# Patient Record
Sex: Male | Born: 1980 | Race: White | Hispanic: No | Marital: Single | Smoking: Current every day smoker
Health system: Southern US, Community
[De-identification: ages and names within clinical notes are randomized; demographics above are authoritative.]

## PROBLEM LIST (undated history)

## (undated) DIAGNOSIS — R569 Unspecified convulsions: Secondary | ICD-10-CM

---

## 2006-09-30 ENCOUNTER — Emergency Department: Payer: Self-pay | Admitting: Emergency Medicine

## 2006-09-30 ENCOUNTER — Other Ambulatory Visit: Payer: Self-pay

## 2011-10-22 ENCOUNTER — Emergency Department: Payer: Self-pay | Admitting: *Deleted

## 2013-01-08 ENCOUNTER — Emergency Department: Payer: Self-pay | Admitting: Emergency Medicine

## 2013-01-08 LAB — BASIC METABOLIC PANEL
BUN: 7 mg/dL (ref 7–18)
Calcium, Total: 9.4 mg/dL (ref 8.5–10.1)
Chloride: 101 mmol/L (ref 98–107)
Co2: 30 mmol/L (ref 21–32)
Creatinine: 1.01 mg/dL (ref 0.60–1.30)

## 2013-01-08 LAB — TROPONIN I: Troponin-I: <0.02 ng/mL

## 2013-01-08 LAB — DRUG SCREEN, URINE
Amphetamines, Ur Screen: NEGATIVE (ref ?–1000)
Barbiturates, Ur Screen: NEGATIVE (ref ?–200)
Cannabinoid 50 Ng, Ur ~~LOC~~: POSITIVE (ref ?–50)
Cocaine Metabolite,Ur ~~LOC~~: NEGATIVE (ref ?–300)
MDMA (Ecstasy)Ur Screen: NEGATIVE (ref ?–500)
Opiate, Ur Screen: NEGATIVE (ref ?–300)
Tricyclic, Ur Screen: NEGATIVE (ref ?–1000)

## 2013-01-08 LAB — VALPROIC ACID LEVEL: Valproic Acid: 78 ug/mL

## 2013-01-08 LAB — CBC
MCH: 31.5 pg (ref 26.0–34.0)
MCV: 89 fL (ref 80–100)
Platelet: 258 10*3/uL (ref 150–440)
RDW: 13.1 % (ref 11.5–14.5)
WBC: 13.4 10*3/uL — ABNORMAL HIGH (ref 3.8–10.6)

## 2013-01-08 LAB — HEPATIC FUNCTION PANEL A (ARMC)
Albumin: 3.7 g/dL (ref 3.4–5.0)
Bilirubin,Total: 0.2 mg/dL (ref 0.2–1.0)
SGOT(AST): 11 U/L — ABNORMAL LOW (ref 15–37)
SGPT (ALT): 12 U/L (ref 12–78)
Total Protein: 7.7 g/dL (ref 6.4–8.2)

## 2014-06-05 ENCOUNTER — Emergency Department: Payer: Self-pay | Admitting: Emergency Medicine

## 2014-08-15 ENCOUNTER — Emergency Department
Admission: EM | Admit: 2014-08-15 | Discharge: 2014-08-15 | Disposition: A | Discharge status: Home / Self Care | Payer: Self-pay | Attending: Student | Admitting: Student

## 2014-08-15 ENCOUNTER — Encounter: Payer: Self-pay | Admitting: Emergency Medicine

## 2014-08-15 DIAGNOSIS — R197 Diarrhea, unspecified: Secondary | ICD-10-CM | POA: Insufficient documentation

## 2014-08-15 DIAGNOSIS — R112 Nausea with vomiting, unspecified: Secondary | ICD-10-CM | POA: Insufficient documentation

## 2014-08-15 DIAGNOSIS — Z720 Tobacco use: Secondary | ICD-10-CM | POA: Insufficient documentation

## 2014-08-15 MED ORDER — DIPHENOXYLATE-ATROPINE 2.5-0.025 MG PO TABS: 1.0000 | ORAL_TABLET | Freq: Four times a day (QID) | ORAL | Status: AC | PRN

## 2014-08-15 MED ORDER — ONDANSETRON HCL 4 MG PO TABS: 4.0000 mg | ORAL_TABLET | Freq: Every day | ORAL | Status: AC | PRN

## 2014-08-15 NOTE — ED Notes (Signed)
Pt came in the ED with the c/o vomiting and nausea for the past 2 days. He has vomited about 8 times and the last emesis was at 12 am last night. He states that it started after  he ate at a new restaurant. Patient reports of having loose stool and has stooled 5 times in the last 2 days. Pt has had nothing to eat since morning but has had some tea and was able to keep it down. Pt reports no abdominal pain.

## 2014-08-15 NOTE — ED Notes (Signed)
Presents with n/v/d for 2 daysa  Last time vomited was last pm needs work note

## 2014-08-15 NOTE — ED Provider Notes (Signed)
[facility] Emergency Department Provider Note  ____________________________________________  Time seen: Approximately 1:46 PM  I have reviewed the triage vital signs and the nursing notes.   HISTORY  Chief Complaint Emesis    HPI Ian Mckee is a 34 y.o. male in the 2 days of vomiting and nausea and diarrhea. States onset 2 days ago status post eating at [employer]. Patient stated was a meat sandwich with mayonnaise vegetables. Patient said he has not eaten anything today is able to tolerate fluids. Patient denies any abdominal pain. Denies any fever or chills.   History reviewed. No pertinent past medical history.  There are no active problems to display for this patient.   History reviewed. No pertinent past surgical history.  No current outpatient prescriptions on file.  Allergies Review of patient's allergies indicates no known allergies.  No family history on file.  Social History History  Substance Use Topics  . Smoking status: Current Every Day Smoker  . Smokeless tobacco: Not on file  . Alcohol Use: No    Review of Systems Constitutional: No fever/chills Eyes: No visual changes. ENT: No sore throat. Cardiovascular: Denies chest pain. Respiratory: Denies shortness of breath. Gastrointestinal: No abdominal pain. Nausea vomiting and diarrhea. Genitourinary: Negative for dysuria. Musculoskeletal: Negative for back pain. Skin: Negative for rash. Neurological: Negative for headaches, focal weakness or numbness. 10-point ROS otherwise negative.  ____________________________________________   PHYSICAL EXAM:  VITAL SIGNS: ED Triage Vitals  Enc Vitals Group     BP 08/15/14 1232 118/80 mmHg     Pulse Rate 08/15/14 1232 105     Resp 08/15/14 1232 20     Temp 08/15/14 1232 98.6 F (37 C)     Temp Source 08/15/14 1232 Oral     SpO2 08/15/14 1232 100 %     Weight 08/15/14 1232 125 lb (56.7 kg)     Height 08/15/14 1232  5\' 7"  (1.702 m)     Head Cir --      Peak Flow --      Pain Score 08/15/14 1227 0     Pain Loc --      Pain Edu? --      Excl. in GC? --    Constitutional: Alert and oriented. Well appearing and in no acute distress. Eyes: Conjunctivae are normal. PERRL. EOMI. Head: Atraumatic. Nose: No congestion/rhinnorhea. Mouth/Throat: Mucous membranes are moist.  Oropharynx non-erythematous. Neck: No stridor no deformity. Nuchal range of motion nontender palpation. Hematological/Lymphatic/Immunilogical: No cervical lymphadenopathy. Cardiovascular: Normal rate, regular rhythm. Grossly normal heart sounds.  Good peripheral circulation. Respiratory: Normal respiratory effort.  No retractions. Lungs CTAB. Gastrointestinal: Soft and nontender. No distention. Hyperactive bowel sounds. No CVA gotten. Musculoskeletal: No lower extremity tenderness nor edema.  No joint effusions. Neurologic:  Normal speech and language. No gross focal neurologic deficits are appreciated. Speech is normal. No gait instability. Skin:  Skin is warm, dry and intact. No rash noted. Psychiatric: Mood and affect are normal. Speech and behavior are normal.  ____________________________________________   LABS (all labs ordered are listed, but only abnormal results are displayed)  Labs Reviewed - No data to display ____________________________________________  EKG   ____________________________________________  RADIOLOGY   ____________________________________________   PROCEDURES  Procedure(s) performed: None  Critical Care performed: No  ____________________________________________   INITIAL IMPRESSION / ASSESSMENT AND PLAN / ED COURSE  Pertinent labs & imaging results that were available during my care of the patient were reviewed by me and considered in my  medical decision making (see chart for details).  Gastroenteritis. ____________________________________________   FINAL CLINICAL IMPRESSION(S) / ED  DIAGNOSES  Final diagnoses:  Nausea vomiting and diarrhea      QUIRINO KAKOS, PA-C 08/15/14 1352  Gayla Doss, MD 08/15/14 1447

## 2015-01-22 ENCOUNTER — Encounter: Payer: Self-pay | Admitting: Emergency Medicine

## 2015-01-22 ENCOUNTER — Emergency Department
Admission: EM | Admit: 2015-01-22 | Discharge: 2015-01-22 | Disposition: A | Discharge status: Home / Self Care | Payer: Self-pay | Attending: Emergency Medicine | Admitting: Emergency Medicine

## 2015-01-22 ENCOUNTER — Emergency Department: Payer: Self-pay

## 2015-01-22 DIAGNOSIS — Y9289 Other specified places as the place of occurrence of the external cause: Secondary | ICD-10-CM | POA: Insufficient documentation

## 2015-01-22 DIAGNOSIS — Y998 Other external cause status: Secondary | ICD-10-CM | POA: Insufficient documentation

## 2015-01-22 DIAGNOSIS — Y9389 Activity, other specified: Secondary | ICD-10-CM | POA: Insufficient documentation

## 2015-01-22 DIAGNOSIS — S39012A Strain of muscle, fascia and tendon of lower back, initial encounter: Secondary | ICD-10-CM | POA: Insufficient documentation

## 2015-01-22 DIAGNOSIS — Z720 Tobacco use: Secondary | ICD-10-CM | POA: Insufficient documentation

## 2015-01-22 DIAGNOSIS — R52 Pain, unspecified: Secondary | ICD-10-CM

## 2015-01-22 DIAGNOSIS — Z79899 Other long term (current) drug therapy: Secondary | ICD-10-CM | POA: Insufficient documentation

## 2015-01-22 DIAGNOSIS — X501XXA Overexertion from prolonged static or awkward postures, initial encounter: Secondary | ICD-10-CM | POA: Insufficient documentation

## 2015-01-22 HISTORY — DX: Unspecified convulsions: R56.9

## 2015-01-22 MED ORDER — NAPROXEN 500 MG PO TABS: 500.0000 mg | ORAL_TABLET | Freq: Two times a day (BID) | ORAL | Status: DC

## 2015-01-22 MED ORDER — HYDROMORPHONE HCL 1 MG/ML IJ SOLN
1.0000 mg | Freq: Once | INTRAMUSCULAR | Status: AC
  Administered 2015-01-22: 1 mg via INTRAVENOUS
  Filled 2015-01-22: qty 1

## 2015-01-22 MED ORDER — CYCLOBENZAPRINE HCL 10 MG PO TABS: 10.0000 mg | ORAL_TABLET | Freq: Every day | ORAL | Status: DC

## 2015-01-22 MED ORDER — KETOROLAC TROMETHAMINE 30 MG/ML IJ SOLN: 30.0000 mg | Freq: Once | INTRAMUSCULAR | Status: DC

## 2015-01-22 MED ORDER — ONDANSETRON HCL 4 MG/2ML IJ SOLN
4.0000 mg | Freq: Once | INTRAMUSCULAR | Status: AC
  Administered 2015-01-22: 4 mg via INTRAVENOUS
  Filled 2015-01-22: qty 2

## 2015-01-22 MED ORDER — ORPHENADRINE CITRATE 30 MG/ML IJ SOLN
60.0000 mg | Freq: Two times a day (BID) | INTRAMUSCULAR | Status: DC
  Administered 2015-01-22: 60 mg via INTRAVENOUS
  Filled 2015-01-22: qty 2

## 2015-01-22 MED ORDER — METHOCARBAMOL 750 MG PO TABS: 1500.0000 mg | ORAL_TABLET | Freq: Four times a day (QID) | ORAL | Status: DC

## 2015-01-22 MED ORDER — KETOROLAC TROMETHAMINE 10 MG PO TABS: 10.0000 mg | ORAL_TABLET | Freq: Four times a day (QID) | ORAL | Status: DC | PRN

## 2015-01-22 NOTE — ED Notes (Signed)
Patient medicated for pain. Tolerated well. Family at bedside. Call bell within reach.

## 2015-01-22 NOTE — ED Provider Notes (Deleted)
CSN: [id number]     Arrival date & time 01/22/15  1003 History   First MD Initiated Contact with Patient 01/22/15 1012     Chief Complaint  Patient presents with  . Back Pain    HPI Comments: 34 year old male presents today complaining of low back pain that started last night. Patient reports there was no specific injury that caused the pain. He has had this problem once before in the past. He has never had a back surgery or required an MRI. He has not taken anything over-the-counter for his pain. It hurts on the left side of his back, it does not radiate down his legs. He has not had a loss of bowel or bladder function. No perianal or genital numbness.  The history is provided by the patient.    Past Medical History  Diagnosis Date  . Seizures (HCC)    History reviewed. No pertinent past surgical history. History reviewed. No pertinent family history. Social History  Substance Use Topics  . Smoking status: Current Every Day Smoker  . Smokeless tobacco: None  . Alcohol Use: No    Review of Systems  Musculoskeletal: Positive for myalgias, back pain and arthralgias.  All other systems reviewed and are negative.     Allergies  Review of patient's allergies indicates no known allergies.  Home Medications   Prior to Admission medications   Medication Sig Start Date End Date Taking? Authorizing Provider  divalproex (DEPAKOTE) 500 MG DR tablet Take 500 mg by mouth 3 (three) times daily.   Yes Historical Provider, MD  cyclobenzaprine (FLEXERIL) 10 MG tablet Take 1 tablet (10 mg total) by mouth at bedtime. 01/22/15   Luvenia ReddenEmma Weavil V, PA-C  naproxen (NAPROSYN) 500 MG tablet Take 1 tablet (500 mg total) by mouth 2 (two) times daily with a meal. 01/22/15   Wilber OliphantEmma Weavil V, PA-C  ondansetron (ZOFRAN) 4 MG tablet Take 1 tablet (4 mg total) by mouth daily as needed for nausea or vomiting. 08/15/14 08/15/15  Joni Reiningonald K Smith, PA-C   BP 121/72 mmHg  Pulse 82  Temp(Src) 98.2 F (36.8 C) (Oral)   Resp 18  Ht 5\' 6"  (1.676 m)  Wt 120 lb (54.432 kg)  BMI 19.38 kg/m2  SpO2 99% Physical Exam  Constitutional: He is oriented to person, place, and time. Vital signs are normal. He appears well-developed and well-nourished. He is active.  Non-toxic appearance. He does not have a sickly appearance. He does not appear ill.  HENT:  Head: Normocephalic and atraumatic.  Musculoskeletal: Normal range of motion. He exhibits tenderness.       Lumbar back: Normal.       Back:  Neurological: He is alert and oriented to person, place, and time. He has normal strength. No sensory deficit. Gait normal.  5/5 bilateral LE strength  Great toe raise equal bilaterally  Ambulates with normal gait   Skin: Skin is warm and dry.  Psychiatric: He has a normal mood and affect. His behavior is normal. Judgment and thought content normal.  Nursing note and vitals reviewed.   ED Course  Procedures (including critical care time) Labs Review Labs Reviewed - No data to display  Imaging Review No results found. I have personally reviewed and evaluated these images and lab results as part of my medical decision-making.   EKG Interpretation None      MDM  Nontraumatic back pain, no radiculopathy, no red flag warning signs, no bony tenderness. Treat with flexeril/naproxen, moist heat. Follow up  with PCP if no improvement  Final diagnoses:  Lumbar paraspinal muscle spasm        Wilber Oliphant V, PA-C 01/22/15 1037

## 2015-01-22 NOTE — ED Notes (Signed)
States having lower back pain since last pm  States pain is right lower back and started after getting a shirt out of drawer ..Marland Kitchen

## 2015-01-22 NOTE — ED Provider Notes (Signed)
[facility] Emergency Department Provider Note  ____________________________________________  Time seen: Approximately 10:40 AM  I have reviewed the triage vital signs and the nursing notes.   HISTORY  Chief Complaint Back Pain    HPI Ian Mckee is a 34 y.o. male male patient arrived via EMS for complaint of low back pain which occurred last night. Patient lives a twisting incident when he was trying to remove some clothing from a dresser drawer. Patient said he had a very restless night secondary to the back pain. Patient denies any bladder or bowel dysfunction. Patient denies any radicular component to this pain. No palliative measures taken for this complaint.Patient is rating his pain as a 10 over 10.   Past Medical History  Diagnosis Date  . Seizures (HCC)     There are no active problems to display for this patient.   History reviewed. No pertinent past surgical history.  Current Outpatient Rx  Name  Route  Sig  Dispense  Refill  . divalproex (DEPAKOTE) 500 MG DR tablet   Oral   Take 500 mg by mouth 3 (three) times daily.         Marland Kitchen. ketorolac (TORADOL) 10 MG tablet   Oral   Take 1 tablet (10 mg total) by mouth every 6 (six) hours as needed.   20 tablet   0   . methocarbamol (ROBAXIN-750) 750 MG tablet   Oral   Take 2 tablets (1,500 mg total) by mouth 4 (four) times daily.   40 tablet   0   . ondansetron (ZOFRAN) 4 MG tablet   Oral   Take 1 tablet (4 mg total) by mouth daily as needed for nausea or vomiting.   30 tablet   1     Allergies Review of patient's allergies indicates no known allergies.  History reviewed. No pertinent family history.  Social History Social History  Substance Use Topics  . Smoking status: Current Every Day Smoker  . Smokeless tobacco: None  . Alcohol Use: No    Review of Systems Constitutional: No fever/chills Eyes: No visual changes. ENT: No sore throat. Cardiovascular: Denies  chest pain. Respiratory: Denies shortness of breath. Gastrointestinal: No abdominal pain.  No nausea, no vomiting.  No diarrhea.  No constipation. Genitourinary: Negative for dysuria. Musculoskeletal: Positive for back pain. Skin: Negative for rash. Neurological: Negative for headaches, focal weakness or numbness. 10-point ROS otherwise negative.  ____________________________________________   PHYSICAL EXAM:  VITAL SIGNS: ED Triage Vitals  Enc Vitals Group     BP 01/22/15 1011 121/72 mmHg     Pulse Rate 01/22/15 1011 82     Resp 01/22/15 1011 18     Temp 01/22/15 1011 98.2 F (36.8 C)     Temp Source 01/22/15 1011 Oral     SpO2 01/22/15 1011 99 %     Weight 01/22/15 1011 120 lb (54.432 kg)     Height 01/22/15 1011 5\' 6"  (1.676 m)     Head Cir --      Peak Flow --      Pain Score 01/22/15 1006 10     Pain Loc --      Pain Edu? --      Excl. in GC? --     Constitutional: Alert and oriented. Well appearing and in no acute distress. Eyes: Conjunctivae are normal. PERRL. EOMI. Head: Atraumatic. Nose: No congestion/rhinnorhea. Mouth/Throat: Mucous membranes are moist.  Oropharynx non-erythematous. Neck: No stridor.  No cervical spine  tenderness to palpation. Hematological/Lymphatic/Immunilogical: No cervical lymphadenopathy. Cardiovascular: Normal rate, regular rhythm. Grossly normal heart sounds.  Good peripheral circulation. Respiratory: Normal respiratory effort.  No retractions. Lungs CTAB. Gastrointestinal: Soft and nontender. No distention. No abdominal bruits. No CVA tenderness. Musculoskeletal: No lower extremity tenderness nor edema.  No joint effusions. Neurologic:  Normal speech and language. No gross focal neurologic deficits are appreciated. No gait instability. Skin:  Skin is warm, dry and intact. No rash noted. Psychiatric: Mood and affect are normal. Speech and behavior are normal.  ____________________________________________   LABS (all labs ordered are  listed, but only abnormal results are displayed)  Labs Reviewed - No data to display ____________________________________________  EKG   ____________________________________________  RADIOLOGY  No acute findings. I, Joni Reining, personally viewed and evaluated these images (plain radiographs) as part of my medical decision making.   ____________________________________________   PROCEDURES  Procedure(s) performed: None  Critical Care performed: No  ____________________________________________   INITIAL IMPRESSION / ASSESSMENT AND PLAN / ED COURSE  Pertinent labs & imaging results that were available during my care of the patient were reviewed by me and considered in my medical decision making (see chart for details).  Acute lumbar strain. Patient pain level decreased status post Dilaudid, Toradol, and Norflex. Patient given a prescription for Toradol and Robaxin. Patient advised follow-up with family doctor if condition persists. ____________________________________________   FINAL CLINICAL IMPRESSION(S) / ED DIAGNOSES  Final diagnoses:  Lumbar strain, initial encounter      SHANARD TRETO, PA-C 01/22/15 1255  Sharman Cheek, MD 01/22/15 [phone]

## 2015-01-22 NOTE — ED Notes (Signed)
Pt to ed with c/o back pain that started yesterday after twisting while dressing yesterday.  Pt brought in by [facility] with 18g right ac saline lock in place.

## 2018-12-15 ENCOUNTER — Emergency Department (HOSPITAL_COMMUNITY): Payer: Self-pay

## 2018-12-15 ENCOUNTER — Emergency Department (HOSPITAL_COMMUNITY)
Admission: EM | Admit: 2018-12-15 | Discharge: 2018-12-15 | Disposition: A | Discharge status: Home / Self Care | Payer: Self-pay | Attending: Emergency Medicine | Admitting: Emergency Medicine

## 2018-12-15 ENCOUNTER — Other Ambulatory Visit: Payer: Self-pay

## 2018-12-15 ENCOUNTER — Encounter (HOSPITAL_COMMUNITY): Payer: Self-pay | Admitting: Emergency Medicine

## 2018-12-15 DIAGNOSIS — X58XXXA Exposure to other specified factors, initial encounter: Secondary | ICD-10-CM | POA: Insufficient documentation

## 2018-12-15 DIAGNOSIS — S43005A Unspecified dislocation of left shoulder joint, initial encounter: Secondary | ICD-10-CM

## 2018-12-15 DIAGNOSIS — Y999 Unspecified external cause status: Secondary | ICD-10-CM | POA: Insufficient documentation

## 2018-12-15 DIAGNOSIS — S43015A Anterior dislocation of left humerus, initial encounter: Secondary | ICD-10-CM | POA: Insufficient documentation

## 2018-12-15 DIAGNOSIS — F17210 Nicotine dependence, cigarettes, uncomplicated: Secondary | ICD-10-CM | POA: Insufficient documentation

## 2018-12-15 DIAGNOSIS — Y9389 Activity, other specified: Secondary | ICD-10-CM | POA: Insufficient documentation

## 2018-12-15 DIAGNOSIS — Y92003 Bedroom of unspecified non-institutional (private) residence as the place of occurrence of the external cause: Secondary | ICD-10-CM | POA: Insufficient documentation

## 2018-12-15 MED ORDER — FENTANYL CITRATE (PF) 100 MCG/2ML IJ SOLN
50.0000 ug | Freq: Once | INTRAMUSCULAR | Status: AC
  Administered 2018-12-15: 06:00:00 50 ug via INTRAVENOUS
  Filled 2018-12-15: qty 2

## 2018-12-15 MED ORDER — HYDROCODONE-ACETAMINOPHEN 5-325 MG PO TABS: 1.0000 | ORAL_TABLET | Freq: Four times a day (QID) | ORAL | 0 refills | Status: DC | PRN

## 2018-12-15 MED ORDER — PROPOFOL 10 MG/ML IV BOLUS
200.0000 mg | Freq: Once | INTRAVENOUS | Status: AC
  Administered 2018-12-15: 07:00:00 8 mg via INTRAVENOUS
  Filled 2018-12-15: qty 20

## 2018-12-15 MED ORDER — FENTANYL CITRATE (PF) 100 MCG/2ML IJ SOLN
25.0000 ug | Freq: Once | INTRAMUSCULAR | Status: AC
  Administered 2018-12-15: 25 ug via INTRAVENOUS

## 2018-12-15 MED ORDER — FENTANYL CITRATE (PF) 100 MCG/2ML IJ SOLN
INTRAMUSCULAR | Status: AC
  Filled 2018-12-15: qty 2

## 2018-12-15 NOTE — Discharge Instructions (Addendum)
You need to wear the shoulder immobilizer for the next 2 weeks.  If you do not your shoulder can pop back out of joint again.  Use ice packs for comfort.  Take the medication as prescribed.  You could also take ibuprofen 400 mg 4 times a day for pain.  Please follow-up with Dr. Aline Brochure, the orthopedist on call.

## 2018-12-15 NOTE — ED Provider Notes (Addendum)
[facility]ERGENCY DEPARTMENT Provider Note   CSN: [id number] Arrival date & time: 12/15/18  0458   Time seen 5:30 AM  History   Chief Complaint Chief Complaint  Patient presents with  . Shoulder Pain    HPI Ian Mckee is a 38 y.o. male.     HPI patient presents via EMS.  He states he is right-handed.  He states he woke up and he feels like his left shoulder is out of joint.  He denies any known injury although he states he sleeps of his arm under his pillow and may have rolled wrong.  He states he had a shoulder dislocation about 10 years ago when he was moving a couch and it popped out of joint.  He states he has numbness in his left hand up to the level of the wrist.  Patient has a history of seizure disorder, he does not know if he had a seizure.  PCP Patient, No Pcp Per   Past Medical History:  Diagnosis Date  . Seizures (HCC)     There are no active problems to display for this patient.   History reviewed. No pertinent surgical history.      Home Medications    Prior to Admission medications   Medication Sig Start Date End Date Taking? Authorizing Provider  divalproex (DEPAKOTE) 500 MG DR tablet Take 500 mg by mouth 3 (three) times daily.    [provider]  HYDROcodone-acetaminophen (NORCO/VICODIN) 5-325 MG tablet Take 1 tablet by mouth every 6 (six) hours as needed. 12/15/18   Devoria AlbeKnapp, Fredi Hurtado, MD  ketorolac (TORADOL) 10 MG tablet Take 1 tablet (10 mg total) by mouth every 6 (six) hours as needed. 01/22/15   Joni ReiningSmith, Teon K, PA-C  methocarbamol (ROBAXIN-750) 750 MG tablet Take 2 tablets (1,500 mg total) by mouth 4 (four) times daily. 01/22/15   Joni ReiningSmith, Raven K, PA-C    Family History History reviewed. No pertinent family history.  Social History Social History   Tobacco Use  . Smoking status: Current Every Day Smoker    Packs/day: 0.50  . Smokeless tobacco: Never Used  Substance Use Topics  . Alcohol use: No  . Drug use: No  unemployed  Takes care of mother   Allergies   Patient has no known allergies.   Review of Systems Review of Systems  All other systems reviewed and are negative.    Physical Exam Updated Vital Signs BP (!) 129/92   Pulse 71   Temp 98 F (36.7 C) (Oral)   Resp 19   Ht 5\' 6"  (1.676 m)   Wt 54.4 kg   SpO2 100%   BMI 19.37 kg/m   Physical Exam Vitals signs and nursing note reviewed.  Constitutional:      General: He is in acute distress.     Appearance: Normal appearance. He is normal weight.  HENT:     Head: Normocephalic.     Right Ear: External ear normal.     Left Ear: External ear normal.  Eyes:     Extraocular Movements: Extraocular movements intact.     Conjunctiva/sclera: Conjunctivae normal.  Neck:     Musculoskeletal: Normal range of motion.  Cardiovascular:     Rate and Rhythm: Normal rate.  Pulmonary:     Effort: Pulmonary effort is normal. No respiratory distress.  Musculoskeletal:        General: Deformity present.     Comments: Patient is noted to have a step-off in his  left shoulder area and I can feel the humeral head anteriorly.  He has good distal pulses.  Skin:    General: Skin is warm and dry.     Findings: No rash.  Neurological:     General: No focal deficit present.     Mental Status: He is alert and oriented to person, place, and time.     Cranial Nerves: Cranial nerve deficit present.  Psychiatric:        Mood and Affect: Mood normal.        Behavior: Behavior normal.        Thought Content: Thought content normal.      ED Treatments / Results  Labs (all labs ordered are listed, but only abnormal results are displayed) Labs Reviewed - No data to display  EKG None  Radiology Dg Shoulder Left Portable  Result Date: 12/15/2018 CLINICAL DATA:  Post reduction left shoulder. EXAM: LEFT SHOULDER - 1 VIEW COMPARISON:  12/15/2018. FINDINGS: Patient status post reduction previously identified left shoulder anterior dislocation. Mild anterior  subluxation cannot be excluded. No acute bony abnormality. No evidence of fracture. No evidence of separation. IMPRESSION: Patient status post reduction previously identified left shoulder anterior dislocation. Mild anterior subluxation cannot be excluded. No acute bony abnormality. Electronically Signed   By: Marcello Moores  Register   On: 12/15/2018 07:14   Dg Shoulder Left Portable  Result Date: 12/15/2018 CLINICAL DATA:  Woke with left shoulder dislocation EXAM: LEFT SHOULDER - 1 VIEW COMPARISON:  None. FINDINGS: Anterior glenohumeral dislocation without fracture. Normal AC joint alignment. IMPRESSION: Anterior glenohumeral dislocation. Electronically Signed   By: Monte Fantasia M.D.   On: 12/15/2018 06:22    Procedures .Sedation  Date/Time: 12/15/2018 6:25 AM Performed by: Rolland Porter, MD Authorized by: Rolland Porter, MD   Consent:    Consent obtained:  Written   Consent given by:  Patient Universal protocol:    Immediately prior to procedure a time out was called: yes     Patient identity confirmation method:  Provided demographic data, verbally with patient, arm band and hospital-assigned identification number Indications:    Procedure performed:  Dislocation reduction   Procedure necessitating sedation performed by:  Physician performing sedation Pre-sedation assessment:    Time since last food or drink:  Many hours   ASA classification: class 1 - normal, healthy patient     Neck mobility: normal     Mouth opening:  3 or more finger widths   Mallampati score:  I - soft palate, uvula, fauces, pillars visible   Pre-sedation assessments completed and reviewed: airway patency, cardiovascular function, hydration status, mental status, pain level, respiratory function and temperature     Pre-sedation assessments completed and reviewed: nausea/vomiting not reviewed     Pre-sedation assessment completed:  12/15/2018 6:25 AM Immediate pre-procedure details:    Reassessment: Patient reassessed  immediately prior to procedure     Reviewed: vital signs     Verified: bag valve mask available, emergency equipment available, intubation equipment available, IV patency confirmed and oxygen available   Procedure details (see MAR for exact dosages):    Preoxygenation:  Nasal cannula   Sedation:  Propofol   Intended level of sedation: deep   Analgesia:  Fentanyl   Intra-procedure monitoring:  Blood pressure monitoring, continuous capnometry, cardiac monitor, continuous pulse oximetry, frequent LOC assessments and frequent vital sign checks   Intra-procedure events: none     Intra-procedure management:  Airway repositioning   Total Provider sedation time (minutes):  20 Post-procedure  details:    Post-sedation assessment completed:  12/15/2018 6:35 AM   Attendance: Constant attendance by certified staff until patient recovered     Recovery: Patient returned to pre-procedure baseline     Post-sedation assessments completed and reviewed: airway patency, cardiovascular function, hydration status, mental status, pain level, respiratory function and temperature     Post-sedation assessments completed and reviewed: nausea/vomiting not reviewed     Patient tolerance:  Tolerated well, no immediate complications Comments:     Propofol was titrated, patient received full sedation at 80 mg IV.  This was obtained at 6:29 AM.  He awakened to verbal and tactile stimuli at 634 and was able to hold conversation.  His pulse ox remained 98 to 100% however he did start to have some snoring and a chin lift was done just to relieve possible obstruction of his airway from his tongue. Reduction of dislocation  Date/Time: 12/15/2018 6:42 AM Performed by: Devoria Albe, MD Authorized by: Devoria Albe, MD  Consent: Written consent obtained. Risks and benefits: risks, benefits and alternatives were discussed Consent given by: patient Patient understanding: patient states understanding of the procedure being performed  Patient consent: the patient's understanding of the procedure matches consent given Procedure consent: procedure consent matches procedure scheduled Relevant documents: relevant documents present and verified Test results: test results available and properly labeled Imaging studies: imaging studies available Patient identity confirmed: verbally with patient, arm band, provided demographic data and hospital-assigned identification number Time out: Immediately prior to procedure a "time out" was called to verify the correct patient, procedure, equipment, support staff and site/side marked as required.  Sedation: Patient sedated: yes Sedatives: propofol Sedation start date/time: 12/15/2018 6:27 AM Sedation end date/time: 12/15/2018 6:34 AM Vitals: Vital signs were monitored during sedation.  Patient tolerance: patient tolerated the procedure well with no immediate complications Comments: I hyperextended his left arm superiorly with distinct feeling of the relocation.  On reexam afterward he has good range of motion of the shoulder and there is no longer a step-off in the shoulder.  I no longer feel the head of the humerus in the anterior chest wall.  The axillary nerve is intact although he states he still has some numbness in his left hand.  Nursing staff and I applied the shoulder immobilizer after his shoulder was reduced.    (including critical care time)  Medications Ordered in ED Medications  propofol (DIPRIVAN) 10 mg/mL bolus/IV push 200 mg (8 mg Intravenous Given 12/15/18 0636)  fentaNYL (SUBLIMAZE) injection 50 mcg (50 mcg Intravenous Given 12/15/18 0553)  fentaNYL (SUBLIMAZE) injection 25 mcg (25 mcg Intravenous Given 12/15/18 1610)     Initial Impression / Assessment and Plan / ED Course  I have reviewed the triage vital signs and the nursing notes.  Pertinent labs & imaging results that were available during my care of the patient were reviewed by me and considered in my  medical decision making (see chart for details).        We discussed his shoulder appears to be dislocated.  Patient was prepared to have procedural sedation done.  Portable chest x-ray was obtained of his shoulder to verify dislocation and make sure there is no underlying fracture.  I have reviewed patient's shoulder x-rays and he appears to have adequate relocation.  Final Clinical Impressions(s) / ED Diagnoses   Final diagnoses:  Dislocation, shoulder, left, initial encounter    ED Discharge Orders         Ordered    HYDROcodone-acetaminophen (NORCO/VICODIN)  5-325 MG tablet  Every 6 hours PRN     12/15/18 0658          Plan discharge  Devoria Albe, MD, Concha Pyo, MD 12/15/18 6812    Devoria Albe, MD 12/15/18 [phone]

## 2018-12-15 NOTE — ED Triage Notes (Signed)
Pt arrives via [facility] w/complaints of L shoulder pain that started at 0400 upon awakening. Pt denies injury. Pt states L hand starting to go numb. Pulses present.

## 2021-08-20 ENCOUNTER — Encounter: Payer: Self-pay | Admitting: Emergency Medicine

## 2021-08-20 ENCOUNTER — Other Ambulatory Visit: Payer: Self-pay

## 2021-08-20 ENCOUNTER — Emergency Department
Admission: EM | Admit: 2021-08-20 | Discharge: 2021-08-20 | Disposition: A | Discharge status: Home / Self Care | Payer: Medicaid Other | Attending: Emergency Medicine | Admitting: Emergency Medicine

## 2021-08-20 ENCOUNTER — Emergency Department: Payer: Medicaid Other

## 2021-08-20 DIAGNOSIS — R569 Unspecified convulsions: Secondary | ICD-10-CM

## 2021-08-20 DIAGNOSIS — G40909 Epilepsy, unspecified, not intractable, without status epilepticus: Secondary | ICD-10-CM | POA: Insufficient documentation

## 2021-08-20 LAB — CBC WITH DIFFERENTIAL/PLATELET
Abs Immature Granulocytes: 0.04 10*3/uL (ref 0.00–0.07)
Basophils Absolute: 0.1 10*3/uL (ref 0.0–0.1)
Basophils Relative: 1 %
Eosinophils Absolute: 0.2 10*3/uL (ref 0.0–0.5)
Eosinophils Relative: 2 %
HCT: 40 % (ref 39.0–52.0)
Hemoglobin: 13.6 g/dL (ref 13.0–17.0)
Immature Granulocytes: 0 %
Lymphocytes Relative: 16 %
Lymphs Abs: 1.6 10*3/uL (ref 0.7–4.0)
MCH: 30.4 pg (ref 26.0–34.0)
MCHC: 34 g/dL (ref 30.0–36.0)
MCV: 89.3 fL (ref 80.0–100.0)
Monocytes Absolute: 0.5 10*3/uL (ref 0.1–1.0)
Monocytes Relative: 5 %
Neutro Abs: 7.7 10*3/uL (ref 1.7–7.7)
Neutrophils Relative %: 76 %
Platelets: 345 10*3/uL (ref 150–400)
RBC: 4.48 MIL/uL (ref 4.22–5.81)
RDW: 13.2 % (ref 11.5–15.5)
WBC: 10.2 10*3/uL (ref 4.0–10.5)
nRBC: 0 % (ref 0.0–0.2)

## 2021-08-20 LAB — COMPREHENSIVE METABOLIC PANEL
ALT: 14 U/L (ref 0–44)
AST: 30 U/L (ref 15–41)
Albumin: 3.2 g/dL — ABNORMAL LOW (ref 3.5–5.0)
Alkaline Phosphatase: 77 U/L (ref 38–126)
Anion gap: 7 (ref 5–15)
BUN: <5 mg/dL — ABNORMAL LOW (ref 6–20)
CO2: 25 mmol/L (ref 22–32)
Calcium: 8.5 mg/dL — ABNORMAL LOW (ref 8.9–10.3)
Chloride: 103 mmol/L (ref 98–111)
Creatinine, Ser: 1.21 mg/dL (ref 0.61–1.24)
GFR, Estimated: >60 mL/min (ref >60)
Glucose, Bld: 126 mg/dL — ABNORMAL HIGH (ref 70–99)
Potassium: 3.1 mmol/L — ABNORMAL LOW (ref 3.5–5.1)
Sodium: 135 mmol/L (ref 135–145)
Total Bilirubin: 0.4 mg/dL (ref 0.3–1.2)
Total Protein: 6.1 g/dL — ABNORMAL LOW (ref 6.5–8.1)

## 2021-08-20 LAB — ETHANOL: Alcohol, Ethyl (B): <10 mg/dL (ref <10)

## 2021-08-20 LAB — VALPROIC ACID LEVEL: Valproic Acid Lvl: <10 ug/mL — ABNORMAL LOW (ref 50.0–100.0)

## 2021-08-20 MED ORDER — SODIUM CHLORIDE 0.9 % IV BOLUS
1000.0000 mL | Freq: Once | INTRAVENOUS | Status: AC
  Administered 2021-08-20: 1000 mL via INTRAVENOUS

## 2021-08-20 MED ORDER — POTASSIUM CHLORIDE CRYS ER 20 MEQ PO TBCR
40.0000 meq | EXTENDED_RELEASE_TABLET | Freq: Once | ORAL | Status: AC
  Administered 2021-08-20: 40 meq via ORAL
  Filled 2021-08-20: qty 2

## 2021-08-20 MED ORDER — VALPROATE SODIUM 100 MG/ML IV SOLN
500.0000 mg | Freq: Once | INTRAVENOUS | Status: AC
  Administered 2021-08-20: 500 mg via INTRAVENOUS
  Filled 2021-08-20: qty 5

## 2021-08-20 MED ORDER — DIVALPROEX SODIUM 500 MG PO DR TAB
500.0000 mg | DELAYED_RELEASE_TABLET | Freq: Three times a day (TID) | ORAL | 0 refills | Status: DC
  Filled 2021-08-20: qty 90, 30d supply, fill #0

## 2021-08-20 MED ORDER — DIVALPROEX SODIUM 500 MG PO DR TAB: 500.0000 mg | DELAYED_RELEASE_TABLET | Freq: Three times a day (TID) | ORAL | 0 refills | Status: DC

## 2021-08-20 NOTE — Discharge Instructions (Signed)
One of the clinics above should be able to see you without significant expense. You should try to get in ASAP for follow-up.  The [facility] can often fill your meds at a reasonable cost. I've sent an Rx there. You can also try to get the paper script filled at [facility], who often has discounts.

## 2021-08-20 NOTE — ED Notes (Signed)
Pt is in no distress, ambulated to the bathroom to void

## 2021-08-20 NOTE — ED Triage Notes (Addendum)
Arrives via [facility].  [place].  Initially patient unresponsive, 2 mg narcan given.  Once patient awoke, confused and post ictal, per EMS.  VS wnl, pulse tachy- 100-120.  500 NS given.  18g LAC.  Patient states he has history of seizures, takes Depakote.  Patient has not taken depakote for over a year.  Patient cannot recall last seizure.

## 2021-08-20 NOTE — ED Provider Notes (Signed)
[facility] Provider Note    Event Date/Time   First MD Initiated Contact with Patient 08/20/21 2046     (approximate)   History   Seizures   HPI  Ian Mckee is a 41 y.o. male  here with seizure. Pt reportedly was witnessed having a seizure in the [place]' parking lot. He is currently homeless for the past week, but has not had his medications in "a year at least." Reports he typically has his seizures when asleep. Does not recall feeling unwell today. He was not asleep today, however, which is somewhat different. Denies regular EtOH use. Denies any focal numbness, weakness. He did bite his R tongue, no ongoing bleeding. No recent head trauma. He is currently homeless.       Physical Exam   Triage Vital Signs: ED Triage Vitals  Enc Vitals Group     BP 08/20/21 1754 125/87     Pulse Rate 08/20/21 1754 (!) 108     Resp 08/20/21 1754 16     Temp 08/20/21 1754 98.8 F (37.1 C)     Temp Source 08/20/21 1754 Oral     SpO2 08/20/21 1754 98 %     Weight 08/20/21 1755 119 lb 14.9 oz (54.4 kg)     Height 08/20/21 1755 5\' 6"  (1.676 m)     Head Circumference --      Peak Flow --      Pain Score 08/20/21 1755 0     Pain Loc --      Pain Edu? --      Excl. in GC? --     Most recent vital signs: Vitals:   08/20/21 1754 08/20/21 2111  BP: 125/87 122/79  Pulse: (!) 108 (!) 102  Resp: 16 16  Temp: 98.8 F (37.1 C)   SpO2: 98% 98%     General: Awake, no distress.  CV:  Good peripheral perfusion. RRR. No murmurs. Resp:  Normal effort. Lungs CTAB. Abd:  No distention. No tenderness. Other:  Superficial wound R tongue, no active bleeding. CNII-XII intact. Strength 5/5 bl UE and LE. Normal sensation to light touch. Normal gait. Normal tone.   ED Results / Procedures / Treatments   Labs (all labs ordered are listed, but only abnormal results are displayed) Labs Reviewed  COMPREHENSIVE METABOLIC PANEL - Abnormal; Notable for the following  components:      Result Value   Potassium 3.1 (*)    Glucose, Bld 126 (*)    BUN <5 (*)    Calcium 8.5 (*)    Total Protein 6.1 (*)    Albumin 3.2 (*)    All other components within normal limits  VALPROIC ACID LEVEL - Abnormal; Notable for the following components:   Valproic Acid Lvl <10 (*)    All other components within normal limits  CBC WITH DIFFERENTIAL/PLATELET  ETHANOL     EKG Normal sinus rhythm, VR 89. PR 148, QRS 84, QTc 464. No acute St elevations or depressions. No ischemia or infarct.   RADIOLOGY CT head: NAICA   I also independently reviewed and agree with radiologist interpretations.   PROCEDURES:  Critical Care performed: No  .1-3 Lead EKG Interpretation  Performed by: [id number], MD Authorized by: Shaune Pollack, MD     Interpretation: normal     ECG rate:  90-100   ECG rate assessment: normal     Rhythm: sinus rhythm     Ectopy: none  Conduction: normal   Comments:     Indication: seizure     MEDICATIONS ORDERED IN ED: Medications  sodium chloride 0.9 % bolus 1,000 mL (0 mLs Intravenous Stopped 08/20/21 2307)  valproate (DEPACON) 500 mg in dextrose 5 % 50 mL IVPB (0 mg Intravenous Stopped 08/20/21 2230)  potassium chloride SA (KLOR-CON M) CR tablet 40 mEq (40 mEq Oral Given 08/20/21 2128)     IMPRESSION / MDM / ASSESSMENT AND PLAN / ED COURSE  I reviewed the triage vital signs and the nursing notes.                               The patient is on the cardiac monitor to evaluate for evidence of arrhythmia and/or significant heart rate changes.   Ddx:  Differential includes the following, with pertinent life- or limb-threatening emergencies considered:  Seizure, convulsive syncope, intoxication, TBI  Patient's presentation is most consistent with acute presentation with potential threat to life or bodily function.  MDM:  41 yo M with h/o seizure disorder here with breakthrough seizure. Pt currently homeless, and has also  been off his medications. No focal deficits. No fevers, chills, or infectious sx. Pt not sure if he hit his head or fell, CT head obtained and is negative for abnormality. Lytes largely wnl, mild hypokaelmia noted likely dietary related. CBC shows no leukocytosis or anemia. Valproic acid undetectable. Pt loaded with depakote, and will send rx to med management clinic to see if they can assist with cost. Will also provide paper rx and goodRx instructions for [facility]. Encouraged adherence with Depakote.2   MEDICATIONS GIVEN IN ED: Medications  sodium chloride 0.9 % bolus 1,000 mL (0 mLs Intravenous Stopped 08/20/21 2307)  valproate (DEPACON) 500 mg in dextrose 5 % 50 mL IVPB (0 mg Intravenous Stopped 08/20/21 2230)  potassium chloride SA (KLOR-CON M) CR tablet 40 mEq (40 mEq Oral Given 08/20/21 2128)     Consults: None   EMR reviewed       FINAL CLINICAL IMPRESSION(S) / ED DIAGNOSES   Final diagnoses:  Seizure (HCC)     Rx / DC Orders   ED Discharge Orders          Ordered    divalproex (DEPAKOTE) 500 MG DR tablet  3 times daily        08/20/21 2216    divalproex (DEPAKOTE) 500 MG DR tablet  3 times daily        08/20/21 2216             Note:  This document was prepared using Dragon voice recognition software and may include unintentional dictation errors.   Shaune Pollack, MD 08/21/21 1141

## 2021-08-21 ENCOUNTER — Other Ambulatory Visit: Payer: Self-pay

## 2021-09-17 ENCOUNTER — Other Ambulatory Visit: Payer: Self-pay | Admitting: Emergency Medicine

## 2021-09-18 ENCOUNTER — Other Ambulatory Visit: Payer: Self-pay

## 2021-10-14 ENCOUNTER — Other Ambulatory Visit: Payer: Self-pay

## 2022-02-23 ENCOUNTER — Ambulatory Visit: Payer: Medicaid Other | Admitting: Physician Assistant

## 2022-02-26 ENCOUNTER — Encounter: Payer: Self-pay | Admitting: *Deleted

## 2022-07-01 ENCOUNTER — Other Ambulatory Visit: Payer: Self-pay | Admitting: Emergency Medicine

## 2022-07-03 ENCOUNTER — Other Ambulatory Visit: Payer: Self-pay

## 2022-08-29 ENCOUNTER — Encounter: Payer: Self-pay | Admitting: *Deleted

## 2022-08-29 ENCOUNTER — Other Ambulatory Visit: Payer: Self-pay

## 2022-08-29 ENCOUNTER — Emergency Department
Admission: EM | Admit: 2022-08-29 | Discharge: 2022-08-29 | Disposition: A | Discharge status: Home / Self Care | Payer: Medicaid Other | Attending: Emergency Medicine | Admitting: Emergency Medicine

## 2022-08-29 DIAGNOSIS — D72829 Elevated white blood cell count, unspecified: Secondary | ICD-10-CM | POA: Diagnosis not present

## 2022-08-29 DIAGNOSIS — T402X1A Poisoning by other opioids, accidental (unintentional), initial encounter: Secondary | ICD-10-CM | POA: Diagnosis not present

## 2022-08-29 DIAGNOSIS — T40601A Poisoning by unspecified narcotics, accidental (unintentional), initial encounter: Secondary | ICD-10-CM

## 2022-08-29 LAB — CBC
HCT: 44.3 % (ref 39.0–52.0)
Hemoglobin: 15.4 g/dL (ref 13.0–17.0)
MCH: 32.3 pg (ref 26.0–34.0)
MCHC: 34.8 g/dL (ref 30.0–36.0)
MCV: 92.9 fL (ref 80.0–100.0)
Platelets: 322 10*3/uL (ref 150–400)
RBC: 4.77 MIL/uL (ref 4.22–5.81)
RDW: 14.4 % (ref 11.5–15.5)
WBC: 13.5 10*3/uL — ABNORMAL HIGH (ref 4.0–10.5)
nRBC: 0 % (ref 0.0–0.2)

## 2022-08-29 LAB — TROPONIN I (HIGH SENSITIVITY): Troponin I (High Sensitivity): 6 ng/L (ref <18)

## 2022-08-29 LAB — BASIC METABOLIC PANEL
Anion gap: 9 (ref 5–15)
BUN: 8 mg/dL (ref 6–20)
CO2: 24 mmol/L (ref 22–32)
Calcium: 8.7 mg/dL — ABNORMAL LOW (ref 8.9–10.3)
Chloride: 101 mmol/L (ref 98–111)
Creatinine, Ser: 1.12 mg/dL (ref 0.61–1.24)
GFR, Estimated: >60 mL/min (ref >60)
Glucose, Bld: 98 mg/dL (ref 70–99)
Potassium: 3.8 mmol/L (ref 3.5–5.1)
Sodium: 134 mmol/L — ABNORMAL LOW (ref 135–145)

## 2022-08-29 LAB — URINE DRUG SCREEN, QUALITATIVE (ARMC ONLY)
Amphetamines, Ur Screen: NOT DETECTED
Barbiturates, Ur Screen: NOT DETECTED
Benzodiazepine, Ur Scrn: NOT DETECTED
Cannabinoid 50 Ng, Ur ~~LOC~~: POSITIVE — AB
Cocaine Metabolite,Ur ~~LOC~~: NOT DETECTED
MDMA (Ecstasy)Ur Screen: NOT DETECTED
Methadone Scn, Ur: NOT DETECTED
Opiate, Ur Screen: NOT DETECTED
Phencyclidine (PCP) Ur S: NOT DETECTED
Tricyclic, Ur Screen: NOT DETECTED

## 2022-08-29 LAB — ETHANOL: Alcohol, Ethyl (B): <10 mg/dL (ref <10)

## 2022-08-29 MED ORDER — NALOXONE HCL 4 MG/0.1ML NA LIQD
NASAL | 1 refills | Status: AC
Start: 2022-08-29 — End: ?

## 2022-08-29 NOTE — ED Provider Notes (Signed)
[facility] Provider Note    Event Date/Time   First MD Initiated Contact with Patient 08/29/22 [phone]     (approximate)   History   Drug Overdose   HPI  Ian Mckee is a 42 y.o. male presents to the emergency department after being found unresponsive.  History is provided by EMS.  States that bystanders called 911 because he was nonresponsive on the side of [address].  When PD arrived patient had pinpoint pupils, was nonresponsive so was given Narcan and woke up.  Denies any drug use.  States that he has been kicked out, currently homeless.  Denies any chest pain or shortness of breath.  States that the next and he remembers that she was waking up in the ambulance.  Glucose was within normal limits.     Physical Exam   Triage Vital Signs: ED Triage Vitals  Enc Vitals Group     BP 08/29/22 0930 118/74     Pulse Rate 08/29/22 0930 100     Resp 08/29/22 0930 14     Temp 08/29/22 0930 98.2 F (36.8 C)     Temp Source 08/29/22 0930 Oral     SpO2 08/29/22 0930 100 %     Weight 08/29/22 0946 119 lb (54 kg)     Height --      Head Circumference --      Peak Flow --      Pain Score 08/29/22 0946 0     Pain Loc --      Pain Edu? --      Excl. in GC? --     Most recent vital signs: Vitals:   08/29/22 1015 08/29/22 1020  BP:    Pulse: 99 91  Resp: 16 12  Temp:    SpO2:      Physical Exam Constitutional:      Appearance: He is well-developed.  HENT:     Head: Atraumatic.  Eyes:     Conjunctiva/sclera: Conjunctivae normal.  Cardiovascular:     Rate and Rhythm: Regular rhythm.  Pulmonary:     Effort: No respiratory distress.  Musculoskeletal:     Cervical back: Normal range of motion.  Skin:    General: Skin is warm.     Capillary Refill: Capillary refill takes less than 2 seconds.  Neurological:     Mental Status: He is alert and oriented to person, place, and time. Mental status is at baseline.     IMPRESSION / MDM /  ASSESSMENT AND PLAN / ED COURSE  I reviewed the triage vital signs and the nursing notes.  Differential diagnosis including overdose, seizure, ACS  EKG  I, Corena Herter, the attending physician, personally viewed and interpreted this ECG.   Rate: Normal  Rhythm: Normal sinus  Axis: Normal  Intervals: Atrial enlargement  ST&T Change: None  No tachycardic or bradycardic dysrhythmias while on cardiac telemetry.  LABS (all labs ordered are listed, but only abnormal results are displayed) Labs interpreted as -    Labs Reviewed  CBC - Abnormal; Notable for the following components:      Result Value   WBC 13.5 (*)    All other components within normal limits  BASIC METABOLIC PANEL - Abnormal; Notable for the following components:   Sodium 134 (*)    Calcium 8.7 (*)    All other components within normal limits  ETHANOL  URINE DRUG SCREEN, QUALITATIVE ([facility] ONLY)  TROPONIN I (HIGH SENSITIVITY)  MDM    Mild leukocytosis.  Creatinine at baseline.  No significant electrolyte abnormalities.  Alcohol level is negative.  Troponin negative.  Low suspicion for ACS.  Most likely had opioid overdose.  Given a prescription for Narcan and encouraged keeping Narcan with him at all time.  Given him outpatient resources and shelter information.   PROCEDURES:  Critical Care performed: No  Procedures  Patient's presentation is most consistent with acute presentation with potential threat to life or bodily function.   MEDICATIONS ORDERED IN ED: Medications - No data to display  FINAL CLINICAL IMPRESSION(S) / ED DIAGNOSES   Final diagnoses:  Opiate overdose, accidental or unintentional, initial encounter (HCC)     Rx / DC Orders   ED Discharge Orders          Ordered    naloxone (NARCAN) nasal spray 4 mg/0.1 mL        08/29/22 0948             Note:  This document was prepared using Dragon voice recognition software and may include unintentional dictation  errors.   Corena Herter, MD 08/29/22 1121

## 2022-08-29 NOTE — ED Triage Notes (Signed)
BIB [facility] from home for overdose, found unresponsive, pinpoint pupils, positive response to FD nasal narcan. VSS per EMS. BS 154. Verbalized pt was calm and cooperative, denied drug use and was interested in leaving several times, pt denies sx or complaints, pt verbalizes "was kicked out", and has many bagged belongings in trash bags. Skin clammy, warm. Arrives alert, NAD, calm, interactive, cooperative.

## 2022-08-29 NOTE — ED Notes (Signed)
EDP at BS 

## 2022-10-11 ENCOUNTER — Emergency Department: Payer: Medicaid Other

## 2022-10-11 ENCOUNTER — Emergency Department
Admission: EM | Admit: 2022-10-11 | Discharge: 2022-10-12 | Disposition: A | Discharge status: Home / Self Care | Payer: Medicaid Other | Attending: Emergency Medicine | Admitting: Emergency Medicine

## 2022-10-11 DIAGNOSIS — R569 Unspecified convulsions: Secondary | ICD-10-CM | POA: Diagnosis present

## 2022-10-11 LAB — BASIC METABOLIC PANEL
Anion gap: 15 (ref 5–15)
BUN: 8 mg/dL (ref 6–20)
CO2: 20 mmol/L — ABNORMAL LOW (ref 22–32)
Calcium: 8.7 mg/dL — ABNORMAL LOW (ref 8.9–10.3)
Chloride: 89 mmol/L — ABNORMAL LOW (ref 98–111)
Creatinine, Ser: 1.06 mg/dL (ref 0.61–1.24)
GFR, Estimated: >60 mL/min (ref >60)
Glucose, Bld: 143 mg/dL — ABNORMAL HIGH (ref 70–99)
Potassium: 3 mmol/L — ABNORMAL LOW (ref 3.5–5.1)
Sodium: 124 mmol/L — ABNORMAL LOW (ref 135–145)

## 2022-10-11 LAB — CBC WITH DIFFERENTIAL/PLATELET
Abs Immature Granulocytes: 0.03 10*3/uL (ref 0.00–0.07)
Basophils Absolute: 0.1 10*3/uL (ref 0.0–0.1)
Basophils Relative: 1 %
Eosinophils Absolute: 0.3 10*3/uL (ref 0.0–0.5)
Eosinophils Relative: 2 %
HCT: 38.6 % — ABNORMAL LOW (ref 39.0–52.0)
Hemoglobin: 13.5 g/dL (ref 13.0–17.0)
Immature Granulocytes: 0 %
Lymphocytes Relative: 26 %
Lymphs Abs: 2.9 10*3/uL (ref 0.7–4.0)
MCH: 31.8 pg (ref 26.0–34.0)
MCHC: 35 g/dL (ref 30.0–36.0)
MCV: 91 fL (ref 80.0–100.0)
Monocytes Absolute: 0.5 10*3/uL (ref 0.1–1.0)
Monocytes Relative: 4 %
Neutro Abs: 7.3 10*3/uL (ref 1.7–7.7)
Neutrophils Relative %: 67 %
Platelets: 274 10*3/uL (ref 150–400)
RBC: 4.24 MIL/uL (ref 4.22–5.81)
RDW: 13.3 % (ref 11.5–15.5)
WBC: 11 10*3/uL — ABNORMAL HIGH (ref 4.0–10.5)
nRBC: 0 % (ref 0.0–0.2)

## 2022-10-11 MED ORDER — POTASSIUM CHLORIDE CRYS ER 20 MEQ PO TBCR
40.0000 meq | EXTENDED_RELEASE_TABLET | Freq: Once | ORAL | Status: AC
Start: 2022-10-11 — End: 2022-10-11
  Administered 2022-10-11: 40 meq via ORAL
  Filled 2022-10-11: qty 2

## 2022-10-11 MED ORDER — SODIUM CHLORIDE 0.9 % IV BOLUS
1000.0000 mL | Freq: Once | INTRAVENOUS | Status: AC
  Administered 2022-10-11: 1000 mL via INTRAVENOUS

## 2022-10-11 NOTE — Discharge Instructions (Signed)
Please seek medical attention for any high fevers, chest pain, shortness of breath, change in behavior, persistent vomiting, bloody stool or any other new or concerning symptoms.  

## 2022-10-11 NOTE — ED Provider Notes (Signed)
-----------------------------------------   11:02 PM on 10/11/2022 -----------------------------------------  Assuming care from Dr. Derrill Kay.  In short, Ian Mckee is a 43 y.o. male with a chief complaint of seizure.  Refer to the original H&P for additional details.  The current plan of care is to reassess after imaging and IVF/K+.   Clinical Course as of 10/12/22 0044  Mon Oct 12, 2022  1478 I reassessed the patient.  He is awake and alert.  He said he has a bit of a headache but otherwise feels okay.  He feels comfortable with the plan for discharge as previously recommended by Dr. Derrill Kay.  However, given his unhoused status and recent seizure, we will let him stay in the waiting room overnight for safety.  He is comfortable with this plan. [CF]    Clinical Course User Index [CF] Loleta Rose, MD     Medications  sodium chloride 0.9 % bolus 1,000 mL (1,000 mLs Intravenous New Bag/Given 10/11/22 2338)  potassium chloride SA (KLOR-CON M) CR tablet 40 mEq (40 mEq Oral Given 10/11/22 2337)     ED Discharge Orders     None      Final diagnoses:  Seizure [facility])     Loleta Rose, MD 10/12/22 [phone]

## 2022-10-11 NOTE — ED Notes (Signed)
RN to bedside. Pt asked "can I stay the night? I dont have a ride home". This RN informed pt that wasn't  reason to stay over night but it would be up to the Md.

## 2022-10-11 NOTE — ED Triage Notes (Signed)
[place] gas [place] and had a witnessed seizure, upon EMS arrival pt was postictal. Pt A&Ox3 on arrival. Pt reports not being compliant with his seizure medications.

## 2022-10-11 NOTE — ED Triage Notes (Signed)
Pt has small abrasions to upper L forehead. Seizure precautions in place

## 2022-10-11 NOTE — ED Provider Notes (Signed)
[facility] Provider Note    Event Date/Time   First MD Initiated Contact with Patient 10/11/22 2218     (approximate)   History   Seizures   HPI  Ian Mckee is a 42 y.o. male who presents to the emergency department today via EMS because of concerns for seizure.  Patient says that he has a history of seizures.  He has not been on Depakote for 1 to 2 years because he cannot afford it.  Per EMS report the patient was found prone outside of a gas station with seizure-like activity.  Initially was postictal however returned to baseline during transport.  Patient denies any pain.  Denies any recent illness, fevers.  Denies any chest pain or shortness of breath.     Physical Exam   Triage Vital Signs: ED Triage Vitals  Encounter Vitals Group     BP 10/11/22 2221 113/75     Systolic BP Percentile --      Diastolic BP Percentile --      Pulse Rate 10/11/22 2221 94     Resp 10/11/22 2221 (!) 22     Temp 10/11/22 2221 97.8 F (36.6 C)     Temp Source 10/11/22 2221 Oral     SpO2 10/11/22 2221 98 %     Weight 10/11/22 2222 119 lb 0.8 oz (54 kg)     Height 10/11/22 2222 5\' 6"  (1.676 m)     Head Circumference --      Peak Flow --      Pain Score 10/11/22 2222 2     Pain Loc --      Pain Education --      Exclude from Growth Chart --     Most recent vital signs: Vitals:   10/11/22 2221  BP: 113/75  Pulse: 94  Resp: (!) 22  Temp: 97.8 F (36.6 C)  SpO2: 98%   General: Awake, alert, oriented. CV:  Good peripheral perfusion. Regular rate and rhythm. Resp:  Normal effort. Lungs clear. Abd:  No distention.  Other:  Abrasions to forehead and nose, right knuckles.   ED Results / Procedures / Treatments   Labs (all labs ordered are listed, but only abnormal results are displayed) Labs Reviewed - No data to display   EKG  I, Phineas Semen, attending physician, personally viewed and interpreted this EKG  EKG Time: 2218 Rate:  98 Rhythm: sinus rhythm Axis: normal Intervals: qtc 523 QRS: narrow, q waves v1, v2 ST changes: no st elevation Impression: abnormal ekg   RADIOLOGY I independently interpreted and visualized the CT head/cervical spine. My interpretation: No bleed. No fracture Radiology interpretation: Pending at time of sign out   PROCEDURES:  Critical Care performed: No    MEDICATIONS ORDERED IN ED: Medications - No data to display   IMPRESSION / MDM / ASSESSMENT AND PLAN / ED COURSE  I reviewed the triage vital signs and the nursing notes.                              Differential diagnosis includes, but is not limited to, epilepsy, ICH  Patient's presentation is most consistent with acute presentation with potential threat to life or bodily function.   The patient is on the cardiac monitor to evaluate for evidence of arrhythmia and/or significant heart rate changes.  Patient presented to the emergency department today after seizure.  Patient has history of  epilepsy but has not been on this medication for at least a year.  On exam patient is awake and alert oriented.  Does have some abrasions to head and right hand.  No concern for fracture to the right hand.  Will obtain CT scan of the head and neck.  Blood acute does show hyponatremia and hypokalemia.  Will give IV fluids potassium here in the emergency department.  Do think if CT imaging are negative and patient continues to feel improved would be reasonable to discharge.  Will prepare discharge paperwork with resources.  FINAL CLINICAL IMPRESSION(S) / ED DIAGNOSES   Final diagnoses:  Seizure [facility])     Note:  This document was prepared using Dragon voice recognition software and may include unintentional dictation errors.     Phineas Semen, MD 10/12/22 [phone]

## 2022-10-18 IMAGING — CT CT HEAD W/O CM
4 series · 16 of 47 positions shown, 18 images · non-contrast
Comparison: None Available.

CLINICAL DATA: Mental status changes, unknown cause. Patient was
unresponsive. Patient was given are con. Possible seizure.



[Series 2: head wo · axial · 0.43mm/px · z∈[-133,-23]mm · 7 of 30 slices shown, 9 images]
[im 4/30  brain]
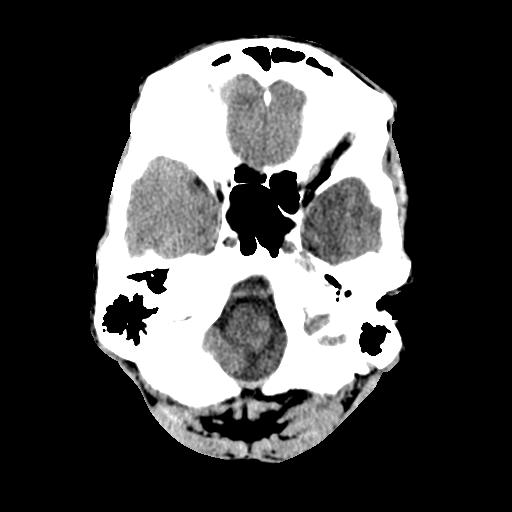
[im 4/30  bone]
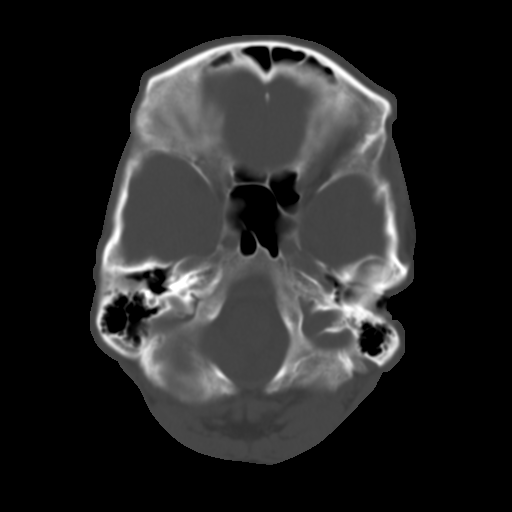
[im 8/30  brain]
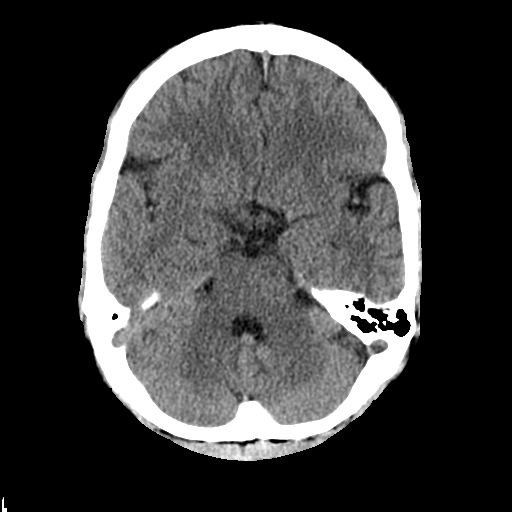
[im 11/30  brain]
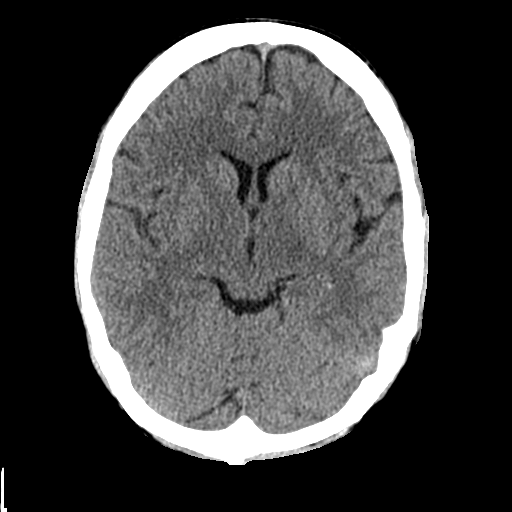
[im 15/30  brain]
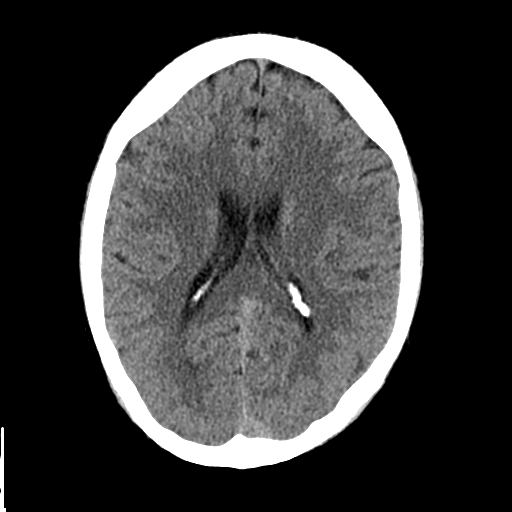
[im 19/30  brain]
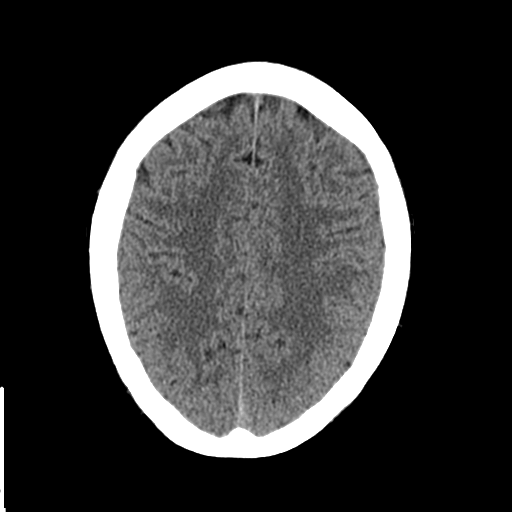
[im 19/30  bone]
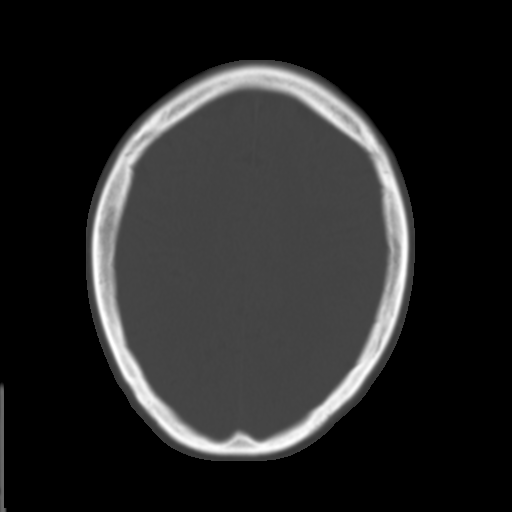
[im 22/30  brain]
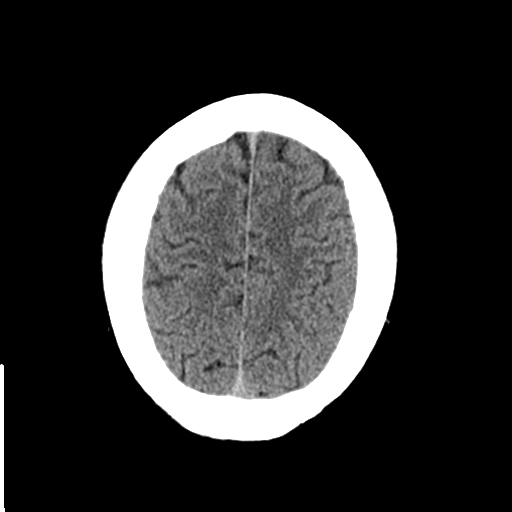
[im 26/30  brain]
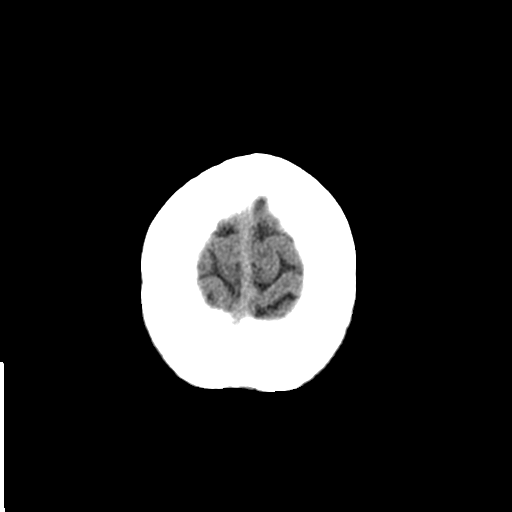

[Series 3: head bone · axial · 0.43mm/px · z∈[-134,-104]mm · 3 of 75 slices shown]
[im 8/75  bone]
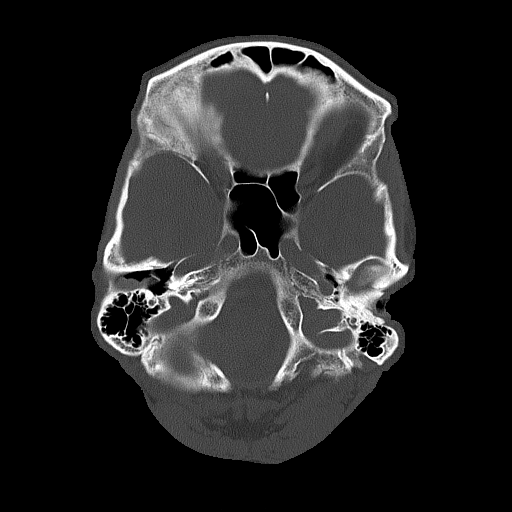
[im 15/75  bone]
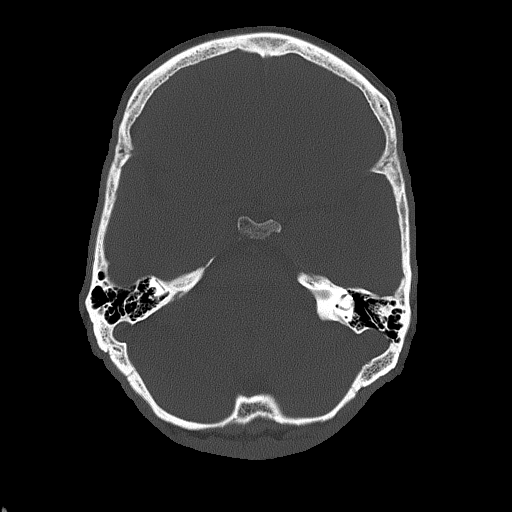
[im 23/75  bone]
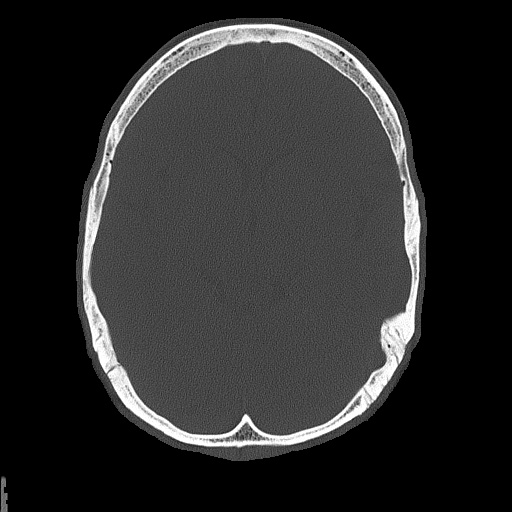

[Series 4: coronal soft tissue · coronal · 0.32mm/px · 3 of 65 slices shown]
[im 22/65  brain]
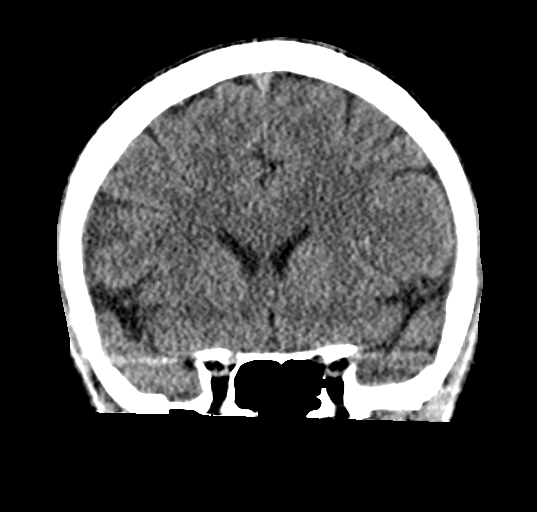
[im 29/65  brain]
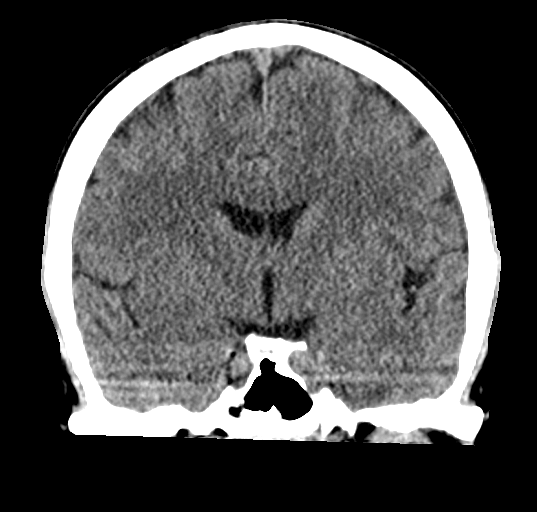
[im 36/65  brain]
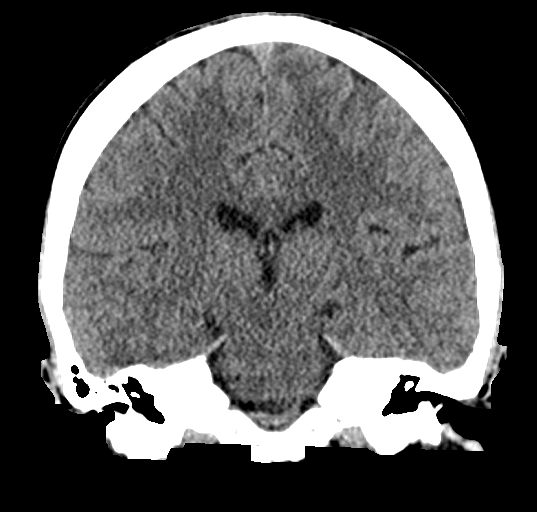

[Series 5: sagittal soft tissue · sagittal · 0.32mm/px · 3 of 55 slices shown]
[im 19/55  brain]
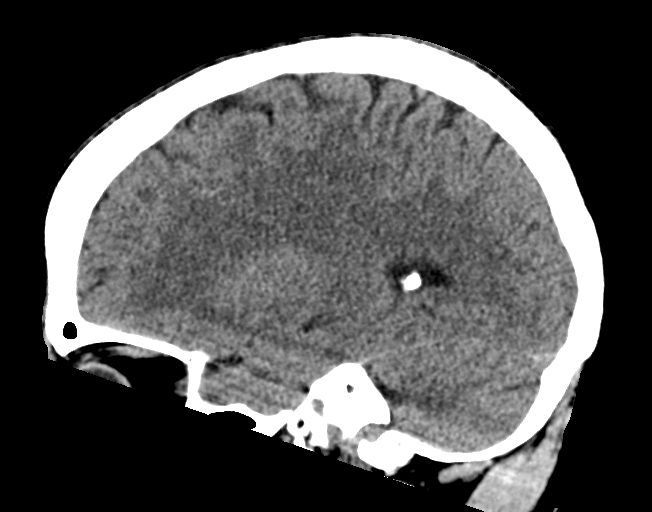
[im 28/55  brain]
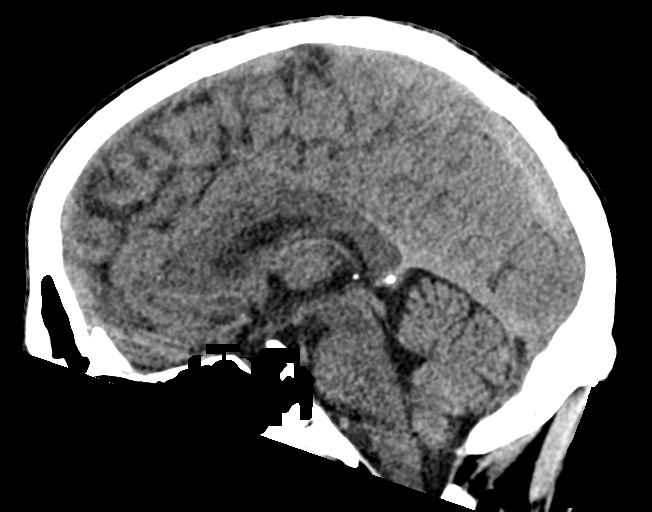
[im 37/55  brain]
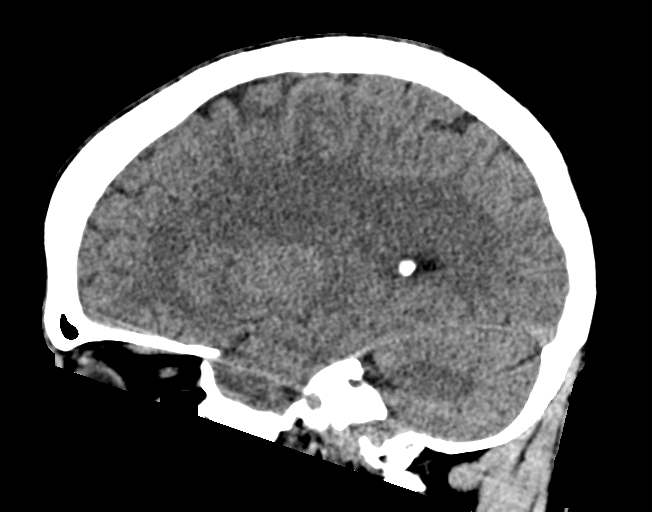

[16 of 47 positions shown; findings below may reference images not displayed]

FINDINGS: Brain: No acute infarct, hemorrhage, or mass lesion is present. The
ventricles are of normal size. No significant extraaxial fluid
collection is present. No significant white matter lesions are
present.

The brainstem and cerebellum are within normal limits.

Vascular: No hyperdense vessel or unexpected calcification.

Skull: Calvarium is intact. No focal lytic or blastic lesions are
present.

Sinuses/Orbits: The paranasal sinuses and mastoid air cells are
clear. The globes and orbits are within normal limits.
IMPRESSION: Negative CT of the head.

## 2022-11-19 ENCOUNTER — Emergency Department: Payer: Medicaid Other

## 2022-11-19 ENCOUNTER — Encounter: Payer: Self-pay | Admitting: Medical Oncology

## 2022-11-19 ENCOUNTER — Emergency Department
Admission: EM | Admit: 2022-11-19 | Discharge: 2022-11-19 | Disposition: A | Discharge status: Home / Self Care | Payer: Medicaid Other | Attending: Emergency Medicine | Admitting: Emergency Medicine

## 2022-11-19 DIAGNOSIS — Y92481 Parking lot as the place of occurrence of the external cause: Secondary | ICD-10-CM | POA: Diagnosis not present

## 2022-11-19 DIAGNOSIS — Z91148 Patient's other noncompliance with medication regimen for other reason: Secondary | ICD-10-CM | POA: Insufficient documentation

## 2022-11-19 DIAGNOSIS — R569 Unspecified convulsions: Secondary | ICD-10-CM | POA: Insufficient documentation

## 2022-11-19 DIAGNOSIS — X58XXXA Exposure to other specified factors, initial encounter: Secondary | ICD-10-CM | POA: Diagnosis not present

## 2022-11-19 DIAGNOSIS — S0990XA Unspecified injury of head, initial encounter: Secondary | ICD-10-CM | POA: Diagnosis present

## 2022-11-19 DIAGNOSIS — S0001XA Abrasion of scalp, initial encounter: Secondary | ICD-10-CM | POA: Insufficient documentation

## 2022-11-19 LAB — CBC WITH DIFFERENTIAL/PLATELET
Abs Immature Granulocytes: 0.02 10*3/uL (ref 0.00–0.07)
Basophils Absolute: 0.1 10*3/uL (ref 0.0–0.1)
Basophils Relative: 1 %
Eosinophils Absolute: 0.2 10*3/uL (ref 0.0–0.5)
Eosinophils Relative: 2 %
HCT: 37.1 % — ABNORMAL LOW (ref 39.0–52.0)
Hemoglobin: 12.4 g/dL — ABNORMAL LOW (ref 13.0–17.0)
Immature Granulocytes: 0 %
Lymphocytes Relative: 20 %
Lymphs Abs: 2.1 10*3/uL (ref 0.7–4.0)
MCH: 31.6 pg (ref 26.0–34.0)
MCHC: 33.4 g/dL (ref 30.0–36.0)
MCV: 94.4 fL (ref 80.0–100.0)
Monocytes Absolute: 0.6 10*3/uL (ref 0.1–1.0)
Monocytes Relative: 5 %
Neutro Abs: 7.7 10*3/uL (ref 1.7–7.7)
Neutrophils Relative %: 72 %
Platelets: 310 10*3/uL (ref 150–400)
RBC: 3.93 MIL/uL — ABNORMAL LOW (ref 4.22–5.81)
RDW: 13.5 % (ref 11.5–15.5)
WBC: 10.7 10*3/uL — ABNORMAL HIGH (ref 4.0–10.5)
nRBC: 0 % (ref 0.0–0.2)

## 2022-11-19 LAB — BASIC METABOLIC PANEL
Anion gap: 11 (ref 5–15)
BUN: 7 mg/dL (ref 6–20)
CO2: 23 mmol/L (ref 22–32)
Calcium: 8.8 mg/dL — ABNORMAL LOW (ref 8.9–10.3)
Chloride: 102 mmol/L (ref 98–111)
Creatinine, Ser: 0.97 mg/dL (ref 0.61–1.24)
GFR, Estimated: >60 mL/min (ref >60)
Glucose, Bld: 94 mg/dL (ref 70–99)
Potassium: 4.1 mmol/L (ref 3.5–5.1)
Sodium: 136 mmol/L (ref 135–145)

## 2022-11-19 LAB — CBG MONITORING, ED: Glucose-Capillary: 88 mg/dL (ref 70–99)

## 2022-11-19 MED ORDER — VALPROATE SODIUM 100 MG/ML IV SOLN
500.0000 mg | Freq: Once | INTRAVENOUS | Status: AC
  Administered 2022-11-19: 500 mg via INTRAVENOUS
  Filled 2022-11-19: qty 5

## 2022-11-19 MED ORDER — SODIUM CHLORIDE 0.9 % IV SOLN: 250.0000 mL | INTRAVENOUS | Status: DC

## 2022-11-19 MED ORDER — NOREPINEPHRINE 4 MG/250ML-% IV SOLN: 2.0000 ug/min | INTRAVENOUS | Status: DC

## 2022-11-19 MED ORDER — DIVALPROEX SODIUM 500 MG PO DR TAB
500.0000 mg | DELAYED_RELEASE_TABLET | Freq: Three times a day (TID) | ORAL | 1 refills | Status: DC
  Filled 2022-11-19: qty 90, 30d supply, fill #0

## 2022-11-19 NOTE — ED Triage Notes (Signed)
Pt from [employer] parking lot. Pt was found lying prone on sidewalk by bystanders. Appears that pt had possible seizure. Hx of same, has not taken depakote in over a year. Pt has abrasion to left cheek. EMS reports pt was postictal upon their arrival. Pt A/O x 4 upon arrival to ED. Denies pain.

## 2022-11-19 NOTE — ED Notes (Signed)
CBG 88 

## 2022-11-19 NOTE — ED Provider Notes (Signed)
[facility] Provider Note    Event Date/Time   First MD Initiated Contact with Patient 11/19/22 1817     (approximate)   History   Chief Complaint Seizures   HPI  Ian Mckee is a 42 y.o. male with past medical history of seizures who presents to the ED for seizure.  Per EMS, patient is unhoused and spends much of the day sitting outside of the local grocery store.  He was found by a bystander facedown on the ground outside of the grocery store today and EMS was called, when they arrived patient appeared to be disoriented.  He gradually regained consciousness during transport, he is now awake and alert on arrival to the ED.  He states that he feels like he probably had a seizure, reports a history of seizures and has not taken medication for it in over 1 year.  He denies any headache or neck pain, EMS does state small abrasions noted to left cheek and frontal scalp.  Patient denies any alcohol or drug use beyond marijuana.     Physical Exam   Triage Vital Signs: ED Triage Vitals [11/19/22 1821]  Encounter Vitals Group     BP      Systolic BP Percentile      Diastolic BP Percentile      Pulse      Resp      Temp      Temp src      SpO2      Weight 119 lb 0.8 oz (54 kg)     Height 5\' 6"  (1.676 m)     Head Circumference      Peak Flow      Pain Score 0     Pain Loc      Pain Education      Exclude from Growth Chart     Most recent vital signs: Vitals:   11/19/22 1945 11/19/22 2000  BP:  134/88  Pulse: 81 78  Resp: 13 15  Temp:    SpO2: 100% 100%    Constitutional: Alert and oriented. Eyes: Conjunctivae are normal. Head: Abrasion noted to left frontal scalp as well as left cheek with dried blood, no lacerations or bony step-offs. Nose: No congestion/rhinnorhea. Mouth/Throat: Mucous membranes are moist.  Neck: No midline cervical spine tenderness to palpation. Cardiovascular: Normal rate, regular rhythm. Grossly normal heart  sounds.  2+ radial pulses bilaterally. Respiratory: Normal respiratory effort.  No retractions. Lungs CTAB.  No chest wall tenderness to palpation. Gastrointestinal: Soft and nontender. No distention. Musculoskeletal: No lower extremity tenderness nor edema.  No upper extremity bony tenderness to palpation. Neurologic:  Normal speech and language. No gross focal neurologic deficits are appreciated.    ED Results / Procedures / Treatments   Labs (all labs ordered are listed, but only abnormal results are displayed) Labs Reviewed  CBC WITH DIFFERENTIAL/PLATELET - Abnormal; Notable for the following components:      Result Value   WBC 10.7 (*)    RBC 3.93 (*)    Hemoglobin 12.4 (*)    HCT 37.1 (*)    All other components within normal limits  BASIC METABOLIC PANEL - Abnormal; Notable for the following components:   Calcium 8.8 (*)    All other components within normal limits  CBG MONITORING, ED     EKG  ED ECG REPORT I, Chesley Noon, the attending physician, personally viewed and interpreted this ECG.   Date: 11/19/2022  EKG  Time: 18:24  Rate: 101  Rhythm: sinus tachycardia  Axis: Normal  Intervals:none  ST&T Change: None  RADIOLOGY CT head reviewed and interpreted by me with no hemorrhage or midline shift.  PROCEDURES:  Critical Care performed: No  Procedures   MEDICATIONS ORDERED IN ED: Medications  valproate (DEPACON) 500 mg in dextrose 5 % 50 mL IVPB (0 mg Intravenous Stopped 11/19/22 2023)     IMPRESSION / MDM / ASSESSMENT AND PLAN / ED COURSE  I reviewed the triage vital signs and the nursing notes.                              42 y.o. male with past medical history of seizures presents to the ED after being found facedown in a parking lot, now states he feels like he had a seizure.  Patient's presentation is most consistent with acute presentation with potential threat to life or bodily function.  Differential diagnosis includes, but is not limited  to, seizure, syncope, medication noncompliance, arrhythmia, anemia, electrolyte abnormality, AKI.  Patient nontoxic-appearing and in no acute distress, vital signs are unremarkable.  EKG shows no evidence of arrhythmia or ischemia, patient reportedly postictal with EMS and I suspect he had a seizure today.  He is now awake and alert with no focal neurologic deficits, does have signs of trauma to his head and we will check CT head and cervical spine.  Labs without significant anemia, leukocytosis, tract abnormality, or AKI.  We will give his usual dose of Depakote and observe here in the ED to ensure no recurrent seizures.  CT head and cervical spine are negative for acute process, labs are reassuring with no significant anemia, leukocytosis, lecture abnormality, or AKI.  Patient with no further seizure episodes here in the ED and is appropriate for discharge home with outpatient neurology follow-up.  Will refill his Depakote and he was counseled to return to the ED for new or worsening symptoms.  Patient agrees with plan.      FINAL CLINICAL IMPRESSION(S) / ED DIAGNOSES   Final diagnoses:  Seizure (HCC)  Noncompliance with medication regimen     Rx / DC Orders   ED Discharge Orders          Ordered    divalproex (DEPAKOTE) 500 MG DR tablet  3 times daily        11/19/22 2058             Note:  This document was prepared using Dragon voice recognition software and may include unintentional dictation errors.   Chesley Noon, MD 11/19/22 2059

## 2022-11-20 ENCOUNTER — Other Ambulatory Visit: Payer: Self-pay

## 2022-11-23 NOTE — Group Note (Deleted)

## 2022-12-01 ENCOUNTER — Other Ambulatory Visit: Payer: Self-pay

## 2022-12-08 ENCOUNTER — Emergency Department: Payer: Medicaid Other

## 2022-12-08 ENCOUNTER — Other Ambulatory Visit: Payer: Self-pay

## 2022-12-08 ENCOUNTER — Emergency Department
Admission: EM | Admit: 2022-12-08 | Discharge: 2022-12-08 | Disposition: A | Discharge status: Home / Self Care | Payer: Medicaid Other | Attending: Student in an Organized Health Care Education/Training Program | Admitting: Student in an Organized Health Care Education/Training Program

## 2022-12-08 DIAGNOSIS — R569 Unspecified convulsions: Secondary | ICD-10-CM

## 2022-12-08 DIAGNOSIS — G40909 Epilepsy, unspecified, not intractable, without status epilepticus: Secondary | ICD-10-CM | POA: Insufficient documentation

## 2022-12-08 LAB — CBC
HCT: 36.3 % — ABNORMAL LOW (ref 39.0–52.0)
Hemoglobin: 12.7 g/dL — ABNORMAL LOW (ref 13.0–17.0)
MCH: 32.5 pg (ref 26.0–34.0)
MCHC: 35 g/dL (ref 30.0–36.0)
MCV: 92.8 fL (ref 80.0–100.0)
Platelets: 346 10*3/uL (ref 150–400)
RBC: 3.91 MIL/uL — ABNORMAL LOW (ref 4.22–5.81)
RDW: 14.6 % (ref 11.5–15.5)
WBC: 13 10*3/uL — ABNORMAL HIGH (ref 4.0–10.5)
nRBC: 0 % (ref 0.0–0.2)

## 2022-12-08 LAB — BASIC METABOLIC PANEL
Anion gap: 13 (ref 5–15)
BUN: 15 mg/dL (ref 6–20)
CO2: 23 mmol/L (ref 22–32)
Calcium: 9.1 mg/dL (ref 8.9–10.3)
Chloride: 100 mmol/L (ref 98–111)
Creatinine, Ser: 1.01 mg/dL (ref 0.61–1.24)
GFR, Estimated: >60 mL/min (ref >60)
Glucose, Bld: 139 mg/dL — ABNORMAL HIGH (ref 70–99)
Potassium: 3.6 mmol/L (ref 3.5–5.1)
Sodium: 136 mmol/L (ref 135–145)

## 2022-12-08 MED ORDER — DIVALPROEX SODIUM 500 MG PO DR TAB
500.0000 mg | DELAYED_RELEASE_TABLET | Freq: Three times a day (TID) | ORAL | 1 refills | Status: DC
  Filled 2022-12-08: qty 90, 30d supply, fill #0
  Filled 2023-02-22: qty 90, 30d supply, fill #1

## 2022-12-08 MED ORDER — DIVALPROEX SODIUM 500 MG PO DR TAB
500.0000 mg | DELAYED_RELEASE_TABLET | Freq: Once | ORAL | Status: AC
  Administered 2022-12-08: 500 mg via ORAL
  Filled 2022-12-08: qty 1

## 2022-12-08 NOTE — ED Provider Notes (Signed)
[facility] Provider Note    Event Date/Time   First MD Initiated Contact with Patient 12/08/22 1717     (approximate)   History   Seizures   HPI  Ian Mckee is a 42 y.o. male history of seizure disorder presents to the ER after witnessed seizure episode today.  Last only few moments was reportedly generalized tonic-clonic.  Did fall and hit his forehead and nose.  Denies any other injury pain or discomfort.     Physical Exam   Triage Vital Signs: ED Triage Vitals [12/08/22 1712]  Encounter Vitals Group     BP      Systolic BP Percentile      Diastolic BP Percentile      Pulse      Resp      Temp      Temp src      SpO2      Weight 119 lb 0.8 oz (54 kg)     Height 5\' 6"  (1.676 m)     Head Circumference      Peak Flow      Pain Score 3     Pain Loc      Pain Education      Exclude from Growth Chart     Most recent vital signs: Vitals:   12/08/22 1800 12/08/22 1845  BP: 118/82   Pulse: 89 82  Resp: (!) 21 10  Temp:    SpO2: 100% 100%     Constitutional: Alert  Eyes: Conjunctivae are normal.  Head: Atraumatic. Nose: No congestion/rhinnorhea. Mouth/Throat: Mucous membranes are moist.   Neck: Painless ROM.  Cardiovascular:   Good peripheral circulation. Respiratory: Normal respiratory effort.  No retractions.  Gastrointestinal: Soft and nontender.  Musculoskeletal:  no deformity Neurologic:  MAE spontaneously. No gross focal neurologic deficits are appreciated.  Skin:  Skin is warm, dry and intact. No rash noted. Psychiatric: Mood and affect are normal. Speech and behavior are normal.    ED Results / Procedures / Treatments   Labs (all labs ordered are listed, but only abnormal results are displayed) Labs Reviewed  BASIC METABOLIC PANEL - Abnormal; Notable for the following components:      Result Value   Glucose, Bld 139 (*)    All other components within normal limits  CBC - Abnormal; Notable for the  following components:   WBC 13.0 (*)    RBC 3.91 (*)    Hemoglobin 12.7 (*)    HCT 36.3 (*)    All other components within normal limits     EKG  ED ECG REPORT I, Willy Eddy, the attending physician, personally viewed and interpreted this ECG.   Date: 12/08/2022  EKG Time: 17:20  Rate: 100  Rhythm: sinus  Axis: normal  Intervals:normal  ST&T Change: no stemi, no depressions    RADIOLOGY Please see ED Course for my review and interpretation.  I personally reviewed all radiographic images ordered to evaluate for the above acute complaints and reviewed radiology reports and findings.  These findings were personally discussed with the patient.  Please see medical record for radiology report.    PROCEDURES:  Critical Care performed: No  Procedures   MEDICATIONS ORDERED IN ED: Medications  divalproex (DEPAKOTE) DR tablet 500 mg (500 mg Oral Given 12/08/22 1726)     IMPRESSION / MDM / ASSESSMENT AND PLAN / ED COURSE  I reviewed the triage vital signs and the nursing notes.  Differential diagnosis includes, but is not limited to, seizure, noncompliance, electrolyte abnormality, withdrawal, contusion, SDH, IPH, abrasion  Patient presenting to the ER for evaluation of symptoms as described above.  Based on symptoms, risk factors and considered above differential, this presenting complaint could reflect a potentially life-threatening illness therefore the patient will be placed on continuous pulse oximetry and telemetry for monitoring.  Laboratory evaluation will be sent to evaluate for the above complaints.  CT imaging will be ordered for the blood differential.  Does not clinically appear to be in withdrawal.  Likely secondary to lack of access to his Depakote.  Will reorder.   Clinical Course as of 12/08/22 1923  Tue Dec 08, 2022  9147 Patient up in the waiting about the room in no acute distress requesting discharge home. [PR]  1919  CT imaging is reassuring.  Patient stable and appropriate for outpatient follow-up. [PR]    Clinical Course User Index [PR] Willy Eddy, MD     FINAL CLINICAL IMPRESSION(S) / ED DIAGNOSES   Final diagnoses:  Seizure (HCC)     Rx / DC Orders   ED Discharge Orders          Ordered    divalproex (DEPAKOTE) 500 MG DR tablet  3 times daily        12/08/22 1854             Note:  This document was prepared using Dragon voice recognition software and may include unintentional dictation errors.    Willy Eddy, MD 12/08/22 [id number]

## 2022-12-08 NOTE — ED Triage Notes (Signed)
PT arrived via EMS for seizure. Per EMS, pt has a hx of seizure however pt does not take his medication, depakote,  due to not being able to afford them. Pt did fall prior to EMS arrival. Pt is A/Ox2. Pt is able to follow commands and is ambulatory. Pt has an abrasion to the right side of his forehead. Pt has not done any ETOH or drugs in the last 24 hrs.

## 2022-12-09 ENCOUNTER — Other Ambulatory Visit: Payer: Self-pay

## 2023-02-22 ENCOUNTER — Other Ambulatory Visit: Payer: Self-pay

## 2023-02-23 ENCOUNTER — Other Ambulatory Visit: Payer: Self-pay

## 2023-08-26 ENCOUNTER — Other Ambulatory Visit: Payer: Self-pay

## 2023-08-26 ENCOUNTER — Emergency Department
Admission: EM | Admit: 2023-08-26 | Discharge: 2023-08-26 | Disposition: A | Discharge status: Home / Self Care | Attending: Emergency Medicine | Admitting: Emergency Medicine

## 2023-08-26 DIAGNOSIS — M25531 Pain in right wrist: Secondary | ICD-10-CM

## 2023-08-26 DIAGNOSIS — Z5900 Homelessness unspecified: Secondary | ICD-10-CM | POA: Diagnosis not present

## 2023-08-26 DIAGNOSIS — W19XXXA Unspecified fall, initial encounter: Secondary | ICD-10-CM | POA: Diagnosis not present

## 2023-08-26 DIAGNOSIS — R569 Unspecified convulsions: Secondary | ICD-10-CM | POA: Diagnosis not present

## 2023-08-26 DIAGNOSIS — S6991XA Unspecified injury of right wrist, hand and finger(s), initial encounter: Secondary | ICD-10-CM | POA: Diagnosis present

## 2023-08-26 DIAGNOSIS — M79601 Pain in right arm: Secondary | ICD-10-CM | POA: Insufficient documentation

## 2023-08-26 DIAGNOSIS — S61511A Laceration without foreign body of right wrist, initial encounter: Secondary | ICD-10-CM | POA: Diagnosis not present

## 2023-08-26 LAB — BASIC METABOLIC PANEL WITH GFR
Anion gap: 9 (ref 5–15)
BUN: 7 mg/dL (ref 6–20)
CO2: 26 mmol/L (ref 22–32)
Calcium: 8.7 mg/dL — ABNORMAL LOW (ref 8.9–10.3)
Chloride: 103 mmol/L (ref 98–111)
Creatinine, Ser: 0.91 mg/dL (ref 0.61–1.24)
GFR, Estimated: >60 mL/min (ref >60)
Glucose, Bld: 97 mg/dL (ref 70–99)
Potassium: 3.3 mmol/L — ABNORMAL LOW (ref 3.5–5.1)
Sodium: 138 mmol/L (ref 135–145)

## 2023-08-26 LAB — CBC
HCT: 42 % (ref 39.0–52.0)
Hemoglobin: 14.1 g/dL (ref 13.0–17.0)
MCH: 30.7 pg (ref 26.0–34.0)
MCHC: 33.6 g/dL (ref 30.0–36.0)
MCV: 91.5 fL (ref 80.0–100.0)
Platelets: 299 10*3/uL (ref 150–400)
RBC: 4.59 MIL/uL (ref 4.22–5.81)
RDW: 13.2 % (ref 11.5–15.5)
WBC: 9.3 10*3/uL (ref 4.0–10.5)
nRBC: 0 % (ref 0.0–0.2)

## 2023-08-26 MED ORDER — CARBAMIDE PEROXIDE 6.5 % OT SOLN
5.0000 [drp] | Freq: Once | OTIC | Status: AC
  Administered 2023-08-26: 5 [drp] via OTIC
  Filled 2023-08-26: qty 15

## 2023-08-26 MED ORDER — DIVALPROEX SODIUM 500 MG PO DR TAB
500.0000 mg | DELAYED_RELEASE_TABLET | Freq: Once | ORAL | Status: AC
  Administered 2023-08-26: 500 mg via ORAL
  Filled 2023-08-26: qty 1

## 2023-08-26 MED ORDER — DIVALPROEX SODIUM 500 MG PO DR TAB
500.0000 mg | DELAYED_RELEASE_TABLET | Freq: Three times a day (TID) | ORAL | 1 refills | Status: DC
  Filled 2023-08-26: qty 90, 30d supply, fill #0
  Filled 2023-11-14: qty 90, 30d supply, fill #1

## 2023-08-26 NOTE — ED Provider Notes (Signed)
 [facility] Provider Note   Event Date/Time   First MD Initiated Contact with Patient 08/26/23 1114     (approximate) History  Arm Injury  HPI Ian Mckee is a 43 y.o. male with a past medical history of epilepsy who presents complaining of a right arm injury after a presumed seizure the night before.  Patient states that he does not remember all of the events only waking up with a scrape on his arm.  Patient states that he has been off of his normally prescribed Depakote  for the last 2 months due to being homeless and unable to get his medications.  Patient also complains of buildup of wax in both ears causing decreased hearing ROS: Patient currently denies any vision changes, tinnitus, difficulty speaking, facial droop, sore throat, chest pain, shortness of breath, abdominal pain, nausea/vomiting/diarrhea, dysuria, or weakness/numbness/paresthesias in any extremity   Physical Exam  Triage Vital Signs: ED Triage Vitals  Encounter Vitals Group     BP 08/26/23 0949 133/89     Girls Systolic BP Percentile --      Girls Diastolic BP Percentile --      Boys Systolic BP Percentile --      Boys Diastolic BP Percentile --      Pulse Rate 08/26/23 0949 72     Resp 08/26/23 0949 20     Temp 08/26/23 0949 97.7 F (36.5 C)     Temp Source 08/26/23 0949 Oral     SpO2 08/26/23 0949 100 %     Weight --      Height --      Head Circumference --      Peak Flow --      Pain Score 08/26/23 0950 0     Pain Loc --      Pain Education --      Exclude from Growth Chart --    Most recent vital signs: Vitals:   08/26/23 0949  BP: 133/89  Pulse: 72  Resp: 20  Temp: 97.7 F (36.5 C)  SpO2: 100%   General: Awake, oriented x4. CV:  Good peripheral perfusion. Resp:  Normal effort. Abd:  No distention. Other:  Middle-aged well-developed, well-nourished Caucasian male resting comfortably in no acute distress.  Small skin tear to the right dorsal mid forearm that  is hemostatic at this time and superficial.  Patient has significant amount of cerumen that is caused a blockage in bilateral external auditory canals. ED Results / Procedures / Treatments  Labs (all labs ordered are listed, but only abnormal results are displayed) Labs Reviewed  BASIC METABOLIC PANEL WITH GFR - Abnormal; Notable for the following components:      Result Value   Potassium 3.3 (*)    Calcium 8.7 (*)    All other components within normal limits  CBC   EKG ED ECG REPORT I, Charleen Conn, the attending physician, personally viewed and interpreted this ECG. Date: 08/26/2023 EKG Time: 0951 Rate: 70 Rhythm: normal sinus rhythm QRS Axis: normal Intervals: normal ST/T Wave abnormalities: normal Narrative Interpretation: no evidence of acute ischemia PROCEDURES: Critical Care performed: No Procedures MEDICATIONS ORDERED IN ED: Medications  divalproex  (DEPAKOTE ) DR tablet 500 mg (has no administration in time range)  carbamide peroxide (DEBROX) 6.5 % OTIC (EAR) solution 5 drop (has no administration in time range)   IMPRESSION / MDM / ASSESSMENT AND PLAN / ED COURSE  I reviewed the triage vital signs and the nursing notes.  The patient is on the cardiac monitor to evaluate for evidence of arrhythmia and/or significant heart rate changes. Patient's presentation is most consistent with acute presentation with potential threat to life or bodily function. Patient presents after recent seizure episode.  Patient had slow return to baseline mental and physical function per bystanders. No immunosuppresion hx and had no preceding fever. No history of alcohol abuse or suspicion for toxin ingestion. Unlikely stroke, syncope. Unlikely infectious etiology. No preceding trauma.  Workup: EKG, BMP, POCT glucose (pregnancy test if male) and CT Brain.  Field Interventions: None ED Interventions: 500 mg Depakote  Disposition: Discharge home with  primary care follow up in next 24-48 hours.   FINAL CLINICAL IMPRESSION(S) / ED DIAGNOSES   Final diagnoses:  Fall, initial encounter  Right arm pain  Right wrist pain  Seizure (HCC)  Tear of skin of right wrist, initial encounter   Rx / DC Orders   ED Discharge Orders          Ordered    divalproex  (DEPAKOTE ) 500 MG DR tablet  3 times daily        08/26/23 1128    Ambulatory Referral to Primary Care (Establish Care)        08/26/23 1133           Note:  This document was prepared using Dragon voice recognition software and may include unintentional dictation errors.   Shinika Estelle K, MD 08/26/23 1137

## 2023-08-26 NOTE — ED Triage Notes (Signed)
 Pt to Ed via POV from home. Pt ambulatory to triage. Pt reports had a seizure last pm and injured right arm. Right forearm is wrapped and reports right hand swelling. Pt states he is homeless with no transportation and wants help getting back on seizure medications. Pt states has been off his meds since February

## 2023-12-23 ENCOUNTER — Emergency Department
Admission: EM | Admit: 2023-12-23 | Discharge: 2023-12-23 | Discharge status: Against Medical Advice | Attending: Emergency Medicine | Admitting: Emergency Medicine

## 2023-12-23 DIAGNOSIS — Z5321 Procedure and treatment not carried out due to patient leaving prior to being seen by health care provider: Secondary | ICD-10-CM | POA: Diagnosis not present

## 2023-12-23 DIAGNOSIS — R6884 Jaw pain: Secondary | ICD-10-CM | POA: Insufficient documentation

## 2023-12-23 DIAGNOSIS — Z5900 Homelessness unspecified: Secondary | ICD-10-CM | POA: Diagnosis not present

## 2023-12-23 DIAGNOSIS — R569 Unspecified convulsions: Secondary | ICD-10-CM | POA: Diagnosis not present

## 2023-12-23 NOTE — ED Triage Notes (Signed)
 Pt ambulatory to triage.  Pt has right upper jaw pain for 2 days.   no known injury.  Pt reports he may have a had a seizure and it caused his jaw to hurt.  Pt decided he did not want to be seen and left   pt states he is homeless.  No acute resp distress.

## 2024-02-02 ENCOUNTER — Other Ambulatory Visit: Payer: Self-pay

## 2024-02-02 ENCOUNTER — Encounter: Payer: Self-pay | Admitting: Emergency Medicine

## 2024-02-02 ENCOUNTER — Emergency Department
Admission: EM | Admit: 2024-02-02 | Discharge: 2024-02-02 | Disposition: A | Discharge status: Home / Self Care | Attending: Emergency Medicine | Admitting: Emergency Medicine

## 2024-02-02 DIAGNOSIS — G40909 Epilepsy, unspecified, not intractable, without status epilepticus: Secondary | ICD-10-CM | POA: Diagnosis not present

## 2024-02-02 DIAGNOSIS — R569 Unspecified convulsions: Secondary | ICD-10-CM | POA: Diagnosis present

## 2024-02-02 LAB — CBC WITH DIFFERENTIAL/PLATELET
Abs Immature Granulocytes: 0.03 K/uL (ref 0.00–0.07)
Basophils Absolute: 0.1 K/uL (ref 0.0–0.1)
Basophils Relative: 1 %
Eosinophils Absolute: 0.2 K/uL (ref 0.0–0.5)
Eosinophils Relative: 2 %
HCT: 39.2 % (ref 39.0–52.0)
Hemoglobin: 13.4 g/dL (ref 13.0–17.0)
Immature Granulocytes: 0 %
Lymphocytes Relative: 8 %
Lymphs Abs: 0.8 K/uL (ref 0.7–4.0)
MCH: 31.2 pg (ref 26.0–34.0)
MCHC: 34.2 g/dL (ref 30.0–36.0)
MCV: 91.2 fL (ref 80.0–100.0)
Monocytes Absolute: 0.4 K/uL (ref 0.1–1.0)
Monocytes Relative: 4 %
Neutro Abs: 9 K/uL — ABNORMAL HIGH (ref 1.7–7.7)
Neutrophils Relative %: 85 %
Platelets: 309 K/uL (ref 150–400)
RBC: 4.3 MIL/uL (ref 4.22–5.81)
RDW: 12.9 % (ref 11.5–15.5)
WBC: 10.5 K/uL (ref 4.0–10.5)
nRBC: 0 % (ref 0.0–0.2)

## 2024-02-02 LAB — COMPREHENSIVE METABOLIC PANEL WITH GFR
ALT: 12 U/L (ref 0–44)
AST: 24 U/L (ref 15–41)
Albumin: 3.8 g/dL (ref 3.5–5.0)
Alkaline Phosphatase: 84 U/L (ref 38–126)
Anion gap: 11 (ref 5–15)
BUN: 8 mg/dL (ref 6–20)
CO2: 24 mmol/L (ref 22–32)
Calcium: 8.9 mg/dL (ref 8.9–10.3)
Chloride: 105 mmol/L (ref 98–111)
Creatinine, Ser: 1.04 mg/dL (ref 0.61–1.24)
GFR, Estimated: >60 mL/min (ref >60)
Glucose, Bld: 98 mg/dL (ref 70–99)
Potassium: 4.2 mmol/L (ref 3.5–5.1)
Sodium: 139 mmol/L (ref 135–145)
Total Bilirubin: <0.2 mg/dL (ref 0.0–1.2)
Total Protein: 6.6 g/dL (ref 6.5–8.1)

## 2024-02-02 MED ORDER — SODIUM CHLORIDE 0.9 % IV BOLUS
1000.0000 mL | Freq: Once | INTRAVENOUS | Status: AC
  Administered 2024-02-02: 1000 mL via INTRAVENOUS

## 2024-02-02 MED ORDER — DIVALPROEX SODIUM 500 MG PO DR TAB
500.0000 mg | DELAYED_RELEASE_TABLET | Freq: Three times a day (TID) | ORAL | 2 refills | Status: AC
  Filled 2024-02-02: qty 90, 30d supply, fill #0

## 2024-02-02 MED ORDER — DIVALPROEX SODIUM 500 MG PO DR TAB
1000.0000 mg | DELAYED_RELEASE_TABLET | Freq: Once | ORAL | Status: AC
  Administered 2024-02-02: 1000 mg via ORAL
  Filled 2024-02-02: qty 2

## 2024-02-02 NOTE — ED Provider Notes (Signed)
 [facility] Provider Note    Event Date/Time   First MD Initiated Contact with Patient 02/02/24 [phone]     (approximate)   History   Chief Complaint: Seizures (PT to ER via EMS for seizure activity - PT is homeless and has a known seizure disorder but is unable to obtain his medications)   HPI  Ian Mckee is a 43 y.o. male with history of seizures on Depakote  3 times daily who comes to the ED after a suspected seizure today.  He currently denies any pain or injuries, denies any recent illness.  He had run out of his Depakote .  He has been seen in the ED multiple times in the past for seizure that occurs after running out of his Depakote .  Denies any acute concerns.        Past Medical History:  Diagnosis Date   Seizures [facility])     Current Outpatient Rx   Order #: [id number] Class: Normal   Order #: [id number] Class: Normal    History reviewed. No pertinent surgical history.  Physical Exam   Triage Vital Signs: ED Triage Vitals  Encounter Vitals Group     BP 02/02/24 0910 117/79     Girls Systolic BP Percentile --      Girls Diastolic BP Percentile --      Boys Systolic BP Percentile --      Boys Diastolic BP Percentile --      Pulse Rate 02/02/24 0910 92     Resp 02/02/24 0910 20     Temp 02/02/24 0910 97.7 F (36.5 C)     Temp src --      SpO2 02/02/24 0910 95 %     Weight 02/02/24 0920 121 lb 4.1 oz (55 kg)     Height 02/02/24 0920 5' 6 (1.676 m)     Head Circumference --      Peak Flow --      Pain Score --      Pain Loc --      Pain Education --      Exclude from Growth Chart --     Most recent vital signs: Vitals:   02/02/24 1030 02/02/24 1100  BP: 121/72 114/72  Pulse: 83 75  Resp:    Temp:    SpO2: 99% 97%    General: Awake, no distress.  CV:  Good peripheral perfusion.  Regular rate rhythm Resp:  Normal effort.  Clear lungs Abd:  No distention.  Soft nontender Other:  Moist oral influenza   ED Results /  Procedures / Treatments   Labs (all labs ordered are listed, but only abnormal results are displayed) Labs Reviewed  CBC WITH DIFFERENTIAL/PLATELET - Abnormal; Notable for the following components:      Result Value   Neutro Abs 9.0 (*)    All other components within normal limits  COMPREHENSIVE METABOLIC PANEL WITH GFR     EKG    RADIOLOGY    PROCEDURES:  Procedures   MEDICATIONS ORDERED IN ED: Medications  sodium chloride  0.9 % bolus 1,000 mL (0 mLs Intravenous Stopped 02/02/24 1047)  divalproex  (DEPAKOTE ) DR tablet 1,000 mg (1,000 mg Oral Given 02/02/24 0943)     IMPRESSION / MDM / ASSESSMENT AND PLAN / ED COURSE  I reviewed the triage vital signs and the nursing notes.  DDx: Anemia, electrolyte derangement, AKI, epilepsy  Patient's presentation is most consistent with acute presentation with potential threat to life or bodily function.  Clinical Course as of 02/02/24 1520  Wed Feb 02, 2024  1059 Back to baseline on arrival.  Tolerating oral intake.  Labs normal, vital signs normal.  Given oral Depakote , new prescription sent to pharmacy.  Stable for discharge. [PS]    Clinical Course User Index [PS] Viviann Pastor, MD     FINAL CLINICAL IMPRESSION(S) / ED DIAGNOSES   Final diagnoses:  Seizure (HCC)  Nonintractable epilepsy without status epilepticus, unspecified epilepsy type (HCC)     Rx / DC Orders   ED Discharge Orders          Ordered    divalproex  (DEPAKOTE ) 500 MG DR tablet  3 times daily        02/02/24 1059             Note:  This document was prepared using Dragon voice recognition software and may include unintentional dictation errors.   Viviann Pastor, MD 02/02/24 1520

## 2024-02-05 ENCOUNTER — Other Ambulatory Visit: Payer: Self-pay
